# Patient Record
Sex: Male | Born: 1950 | Race: White | Hispanic: No | Marital: Married | Smoking: Former smoker
Health system: Southern US, Community
[De-identification: ages and names within clinical notes are randomized; demographics above are authoritative.]

## PROBLEM LIST (undated history)

## (undated) DIAGNOSIS — E785 Hyperlipidemia, unspecified: Secondary | ICD-10-CM

## (undated) DIAGNOSIS — I10 Essential (primary) hypertension: Secondary | ICD-10-CM

## (undated) DIAGNOSIS — G4733 Obstructive sleep apnea (adult) (pediatric): Secondary | ICD-10-CM

## (undated) HISTORY — PX: KNEE ARTHROSCOPY: SUR90

## (undated) HISTORY — PX: CATARACT EXTRACTION: SUR2

## (undated) HISTORY — PX: FINGER TENDON REPAIR: SHX1640

## (undated) HISTORY — DX: Hyperlipidemia, unspecified: E78.5

## (undated) HISTORY — DX: Obstructive sleep apnea (adult) (pediatric): G47.33

---
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
## 2002-11-07 ENCOUNTER — Ambulatory Visit (HOSPITAL_COMMUNITY): Admission: RE | Admit: 2002-11-07 | Discharge: 2002-11-07 | Payer: Self-pay | Admitting: *Deleted

## 2002-11-07 ENCOUNTER — Encounter: Payer: Self-pay | Admitting: Internal Medicine

## 2005-04-20 ENCOUNTER — Ambulatory Visit: Payer: Self-pay | Admitting: Internal Medicine

## 2005-04-27 ENCOUNTER — Ambulatory Visit: Payer: Self-pay | Admitting: Internal Medicine

## 2005-05-19 ENCOUNTER — Ambulatory Visit: Payer: Self-pay | Admitting: Internal Medicine

## 2005-05-26 ENCOUNTER — Ambulatory Visit: Payer: Self-pay | Admitting: Internal Medicine

## 2005-08-12 ENCOUNTER — Ambulatory Visit: Payer: Self-pay | Admitting: Internal Medicine

## 2005-08-25 ENCOUNTER — Ambulatory Visit: Payer: Self-pay | Admitting: Internal Medicine

## 2006-03-06 ENCOUNTER — Ambulatory Visit: Payer: Self-pay | Admitting: Internal Medicine

## 2006-03-14 ENCOUNTER — Ambulatory Visit: Payer: Self-pay | Admitting: Internal Medicine

## 2006-06-06 ENCOUNTER — Ambulatory Visit: Payer: Self-pay | Admitting: Internal Medicine

## 2006-06-06 LAB — CONVERTED CEMR LAB
ALT: 41 units/L — ABNORMAL HIGH (ref 0–40)
AST: 35 units/L (ref 0–37)
Albumin: 4.1 g/dL (ref 3.5–5.2)
Alkaline Phosphatase: 52 units/L (ref 39–117)
BUN: 21 mg/dL (ref 6–23)
Basophils Absolute: 0 10*3/uL (ref 0.0–0.1)
Basophils Relative: 0 % (ref 0.0–1.0)
CO2: 28 meq/L (ref 19–32)
Calcium: 9.7 mg/dL (ref 8.4–10.5)
Chloride: 107 meq/L (ref 96–112)
Chol/HDL Ratio, serum: 5.1
Cholesterol: 231 mg/dL (ref 0–200)
Creatinine, Ser: 1.2 mg/dL (ref 0.4–1.5)
Eosinophil percent: 3.7 % (ref 0.0–5.0)
GFR calc non Af Amer: 67 mL/min
Glomerular Filtration Rate, Af Am: 81 mL/min/{1.73_m2}
Glucose, Bld: 109 mg/dL — ABNORMAL HIGH (ref 70–99)
HCT: 44.8 % (ref 39.0–52.0)
HDL: 44.9 mg/dL (ref >39.0)
Hemoglobin: 15.2 g/dL (ref 13.0–17.0)
Hgb A1c MFr Bld: 5.8 % (ref 4.6–6.0)
LDL DIRECT: 161.4 mg/dL
Lymphocytes Relative: 33.3 % (ref 12.0–46.0)
MCHC: 33.8 g/dL (ref 30.0–36.0)
MCV: 89.6 fL (ref 78.0–100.0)
Monocytes Absolute: 0.5 10*3/uL (ref 0.2–0.7)
Monocytes Relative: 10.5 % (ref 3.0–11.0)
Neutro Abs: 2.5 10*3/uL (ref 1.4–7.7)
Neutrophils Relative %: 52.5 % (ref 43.0–77.0)
PSA: 0.94 ng/mL (ref 0.10–4.00)
Platelets: 248 10*3/uL (ref 150–400)
Potassium: 4.1 meq/L (ref 3.5–5.1)
RBC: 5.01 M/uL (ref 4.22–5.81)
RDW: 11.6 % (ref 11.5–14.6)
Sodium: 143 meq/L (ref 135–145)
TSH: 1.95 microintl units/mL (ref 0.35–5.50)
Total Bilirubin: 1.3 mg/dL — ABNORMAL HIGH (ref 0.3–1.2)
Total Protein: 6.8 g/dL (ref 6.0–8.3)
Triglyceride fasting, serum: 104 mg/dL (ref 0–149)
VLDL: 21 mg/dL (ref 0–40)
WBC: 4.8 10*3/uL (ref 4.5–10.5)

## 2006-06-13 ENCOUNTER — Ambulatory Visit: Payer: Self-pay | Admitting: Internal Medicine

## 2006-09-12 ENCOUNTER — Ambulatory Visit: Payer: Self-pay | Admitting: Internal Medicine

## 2006-09-12 LAB — CONVERTED CEMR LAB
ALT: 41 units/L — ABNORMAL HIGH (ref 0–40)
AST: 29 units/L (ref 0–37)
Albumin: 4.1 g/dL (ref 3.5–5.2)
Alkaline Phosphatase: 54 units/L (ref 39–117)
Bilirubin, Direct: 0.3 mg/dL (ref 0.0–0.3)
Cholesterol: 188 mg/dL (ref 0–200)
HDL: 42.8 mg/dL (ref >39.0)
LDL Cholesterol: 129 mg/dL — ABNORMAL HIGH (ref 0–99)
Total Bilirubin: 1.6 mg/dL — ABNORMAL HIGH (ref 0.3–1.2)
Total CHOL/HDL Ratio: 4.4
Total Protein: 6.6 g/dL (ref 6.0–8.3)
Triglycerides: 79 mg/dL (ref 0–149)
VLDL: 16 mg/dL (ref 0–40)

## 2006-09-19 ENCOUNTER — Ambulatory Visit: Payer: Self-pay | Admitting: Internal Medicine

## 2007-03-13 ENCOUNTER — Ambulatory Visit: Payer: Self-pay | Admitting: Internal Medicine

## 2007-03-14 LAB — CONVERTED CEMR LAB
ALT: 47 units/L (ref 0–53)
AST: 31 units/L (ref 0–37)
Albumin: 4 g/dL (ref 3.5–5.2)
Alkaline Phosphatase: 55 units/L (ref 39–117)
Bilirubin, Direct: 0.2 mg/dL (ref 0.0–0.3)
Cholesterol: 194 mg/dL (ref 0–200)
HDL: 41.5 mg/dL (ref >39.0)
LDL Cholesterol: 129 mg/dL — ABNORMAL HIGH (ref 0–99)
Total Bilirubin: 1.3 mg/dL — ABNORMAL HIGH (ref 0.3–1.2)
Total CHOL/HDL Ratio: 4.7
Total Protein: 6.6 g/dL (ref 6.0–8.3)
Triglycerides: 120 mg/dL (ref 0–149)
VLDL: 24 mg/dL (ref 0–40)

## 2007-03-21 ENCOUNTER — Ambulatory Visit: Payer: Self-pay | Admitting: Internal Medicine

## 2007-03-21 DIAGNOSIS — E785 Hyperlipidemia, unspecified: Secondary | ICD-10-CM

## 2007-09-17 ENCOUNTER — Ambulatory Visit: Payer: Self-pay | Admitting: Internal Medicine

## 2007-09-17 LAB — CONVERTED CEMR LAB
ALT: 31 units/L (ref 0–53)
AST: 27 units/L (ref 0–37)
Albumin: 4.1 g/dL (ref 3.5–5.2)
BUN: 19 mg/dL (ref 6–23)
Basophils Absolute: 0 10*3/uL (ref 0.0–0.1)
Bilirubin Urine: NEGATIVE
Blood in Urine, dipstick: NEGATIVE
Chloride: 110 meq/L (ref 96–112)
Creatinine, Ser: 1.1 mg/dL (ref 0.4–1.5)
GFR calc Af Amer: 90 mL/min
GFR calc non Af Amer: 74 mL/min
Glucose, Bld: 106 mg/dL — ABNORMAL HIGH (ref 70–99)
HCT: 42.8 % (ref 39.0–52.0)
Hemoglobin: 14.6 g/dL (ref 13.0–17.0)
Lymphocytes Relative: 22.2 % (ref 12.0–46.0)
MCV: 88.6 fL (ref 78.0–100.0)
Neutro Abs: 3.8 10*3/uL (ref 1.4–7.7)
PSA: 0.94 ng/mL (ref 0.10–4.00)
Sodium: 143 meq/L (ref 135–145)
Specific Gravity, Urine: 1.02
TSH: 2.01 microintl units/mL (ref 0.35–5.50)
WBC Urine, dipstick: NEGATIVE
WBC: 5.6 10*3/uL (ref 4.5–10.5)
pH: 7.5

## 2007-09-18 LAB — CONVERTED CEMR LAB
Albumin: 4.1 g/dL (ref 3.5–5.2)
Alkaline Phosphatase: 51 units/L (ref 39–117)
Basophils Absolute: 0 10*3/uL (ref 0.0–0.1)
Bilirubin, Direct: 0.3 mg/dL (ref 0.0–0.3)
Calcium: 9.6 mg/dL (ref 8.4–10.5)
Creatinine, Ser: 1.1 mg/dL (ref 0.4–1.5)
Eosinophils Absolute: 0.1 10*3/uL (ref 0.0–0.6)
Eosinophils Relative: 2 % (ref 0.0–5.0)
Glucose, Bld: 106 mg/dL — ABNORMAL HIGH (ref 70–99)
HCT: 42.8 % (ref 39.0–52.0)
Hemoglobin: 14.6 g/dL (ref 13.0–17.0)
Lymphocytes Relative: 22.2 % (ref 12.0–46.0)
MCHC: 34.1 g/dL (ref 30.0–36.0)
Monocytes Relative: 9.4 % (ref 3.0–11.0)
Neutro Abs: 3.8 10*3/uL (ref 1.4–7.7)
PSA: 0.94 ng/mL (ref 0.10–4.00)
RDW: 11.7 % (ref 11.5–14.6)
TSH: 2.01 microintl units/mL (ref 0.35–5.50)
Total Bilirubin: 1.5 mg/dL — ABNORMAL HIGH (ref 0.3–1.2)
Total CHOL/HDL Ratio: 4.5
VLDL: 32 mg/dL (ref 0–40)
WBC: 5.6 10*3/uL (ref 4.5–10.5)

## 2007-09-26 ENCOUNTER — Encounter: Payer: Self-pay | Admitting: Internal Medicine

## 2007-09-26 ENCOUNTER — Ambulatory Visit (HOSPITAL_BASED_OUTPATIENT_CLINIC_OR_DEPARTMENT_OTHER): Admission: RE | Admit: 2007-09-26 | Discharge: 2007-09-26 | Payer: Self-pay | Admitting: Internal Medicine

## 2007-10-09 ENCOUNTER — Ambulatory Visit: Payer: Self-pay | Admitting: Pulmonary Disease

## 2008-02-05 ENCOUNTER — Telehealth (INDEPENDENT_AMBULATORY_CARE_PROVIDER_SITE_OTHER): Payer: Self-pay | Admitting: *Deleted

## 2008-02-26 ENCOUNTER — Encounter: Payer: Self-pay | Admitting: Pulmonary Disease

## 2008-02-26 ENCOUNTER — Encounter: Payer: Self-pay | Admitting: Internal Medicine

## 2008-02-26 ENCOUNTER — Ambulatory Visit (HOSPITAL_BASED_OUTPATIENT_CLINIC_OR_DEPARTMENT_OTHER): Admission: RE | Admit: 2008-02-26 | Discharge: 2008-02-26 | Payer: Self-pay | Admitting: Internal Medicine

## 2008-03-10 ENCOUNTER — Ambulatory Visit: Payer: Self-pay | Admitting: Pulmonary Disease

## 2008-03-20 ENCOUNTER — Telehealth: Payer: Self-pay | Admitting: Internal Medicine

## 2008-03-25 ENCOUNTER — Telehealth: Payer: Self-pay | Admitting: Internal Medicine

## 2008-04-01 ENCOUNTER — Ambulatory Visit: Payer: Self-pay | Admitting: Internal Medicine

## 2008-04-01 LAB — CONVERTED CEMR LAB
ALT: 30 units/L (ref 0–53)
AST: 28 units/L (ref 0–37)
Albumin: 4.3 g/dL (ref 3.5–5.2)
Alkaline Phosphatase: 53 units/L (ref 39–117)
Cholesterol: 181 mg/dL (ref 0–200)
HDL: 40.3 mg/dL (ref >39.0)
Total Bilirubin: 2.6 mg/dL — ABNORMAL HIGH (ref 0.3–1.2)
Total CHOL/HDL Ratio: 4.5
Triglycerides: 106 mg/dL (ref 0–149)
VLDL: 21 mg/dL (ref 0–40)

## 2008-04-09 ENCOUNTER — Ambulatory Visit: Payer: Self-pay | Admitting: Pulmonary Disease

## 2008-05-08 ENCOUNTER — Ambulatory Visit: Payer: Self-pay | Admitting: Pulmonary Disease

## 2008-06-19 ENCOUNTER — Ambulatory Visit: Payer: Self-pay | Admitting: Pulmonary Disease

## 2008-08-12 ENCOUNTER — Ambulatory Visit: Payer: Self-pay | Admitting: Internal Medicine

## 2008-08-12 LAB — CONVERTED CEMR LAB
AST: 23 units/L (ref 0–37)
Bilirubin Urine: NEGATIVE
CO2: 28 meq/L (ref 19–32)
Chloride: 111 meq/L (ref 96–112)
Creatinine, Ser: 1 mg/dL (ref 0.4–1.5)
Eosinophils Absolute: 0.1 10*3/uL (ref 0.0–0.7)
Eosinophils Relative: 1.7 % (ref 0.0–5.0)
GFR calc non Af Amer: 82 mL/min
HCT: 38.9 % — ABNORMAL LOW (ref 39.0–52.0)
Ketones, ur: NEGATIVE mg/dL
Leukocytes, UA: NEGATIVE
Lymphocytes Relative: 27.1 % (ref 12.0–46.0)
Monocytes Absolute: 0.4 10*3/uL (ref 0.1–1.0)
Neutrophils Relative %: 62.1 % (ref 43.0–77.0)
Nitrite: NEGATIVE
PSA: 1 ng/mL (ref 0.10–4.00)
Potassium: 4 meq/L (ref 3.5–5.1)
RBC: 4.38 M/uL (ref 4.22–5.81)
RDW: 11.7 % (ref 11.5–14.6)
Sodium: 144 meq/L (ref 135–145)
TSH: 1.48 microintl units/mL (ref 0.35–5.50)
Total Bilirubin: 1.4 mg/dL — ABNORMAL HIGH (ref 0.3–1.2)
Total Protein, Urine: NEGATIVE mg/dL
Triglycerides: 72 mg/dL (ref 0–149)
Urine Glucose: NEGATIVE mg/dL
Urobilinogen, UA: 0.2 (ref 0.0–1.0)
WBC: 4.7 10*3/uL (ref 4.5–10.5)

## 2008-08-19 ENCOUNTER — Ambulatory Visit: Payer: Self-pay | Admitting: Internal Medicine

## 2008-12-16 ENCOUNTER — Ambulatory Visit: Payer: Self-pay | Admitting: Pulmonary Disease

## 2008-12-16 DIAGNOSIS — G4733 Obstructive sleep apnea (adult) (pediatric): Secondary | ICD-10-CM | POA: Insufficient documentation

## 2009-02-24 ENCOUNTER — Ambulatory Visit: Payer: Self-pay | Admitting: Internal Medicine

## 2009-02-24 LAB — CONVERTED CEMR LAB
ALT: 30 units/L (ref 0–53)
BUN: 19 mg/dL (ref 6–23)
Bilirubin, Direct: 0.2 mg/dL (ref 0.0–0.3)
Creatinine, Ser: 0.9 mg/dL (ref 0.4–1.5)
GFR calc non Af Amer: 92.8 mL/min (ref >60)
HDL: 45.1 mg/dL (ref >39.00)
Hgb A1c MFr Bld: 5.8 % (ref 4.6–6.5)
LDL Cholesterol: 85 mg/dL (ref 0–99)
Potassium: 4.5 meq/L (ref 3.5–5.1)
Sodium: 141 meq/L (ref 135–145)
Total Bilirubin: 1.9 mg/dL — ABNORMAL HIGH (ref 0.3–1.2)
Total CHOL/HDL Ratio: 3
Triglycerides: 80 mg/dL (ref 0.0–149.0)

## 2009-03-03 ENCOUNTER — Ambulatory Visit: Payer: Self-pay | Admitting: Internal Medicine

## 2009-09-08 ENCOUNTER — Ambulatory Visit: Payer: Self-pay | Admitting: Internal Medicine

## 2009-09-08 LAB — CONVERTED CEMR LAB
Alkaline Phosphatase: 45 units/L (ref 39–117)
BUN: 17 mg/dL (ref 6–23)
Basophils Relative: 0.2 % (ref 0.0–3.0)
CO2: 32 meq/L (ref 19–32)
Chloride: 107 meq/L (ref 96–112)
Cholesterol: 160 mg/dL (ref 0–200)
Eosinophils Absolute: 0.1 10*3/uL (ref 0.0–0.7)
Eosinophils Relative: 1.9 % (ref 0.0–5.0)
HDL: 61.6 mg/dL (ref >39.00)
Hemoglobin, Urine: NEGATIVE
Hemoglobin: 14.6 g/dL (ref 13.0–17.0)
Hgb A1c MFr Bld: 5.6 % (ref 4.6–6.5)
Ketones, ur: NEGATIVE mg/dL
Lymphocytes Relative: 32.4 % (ref 12.0–46.0)
Lymphs Abs: 1.3 10*3/uL (ref 0.7–4.0)
MCV: 93.7 fL (ref 78.0–100.0)
Monocytes Absolute: 0.4 10*3/uL (ref 0.1–1.0)
Monocytes Relative: 10.3 % (ref 3.0–12.0)
Neutro Abs: 2.1 10*3/uL (ref 1.4–7.7)
Neutrophils Relative %: 55.2 % (ref 43.0–77.0)
Nitrite: NEGATIVE
Potassium: 4.2 meq/L (ref 3.5–5.1)
RBC: 4.73 M/uL (ref 4.22–5.81)
RDW: 11.8 % (ref 11.5–14.6)
Sodium: 142 meq/L (ref 135–145)
Specific Gravity, Urine: 1.01 (ref 1.000–1.030)
TSH: 1.79 microintl units/mL (ref 0.35–5.50)
Total Protein: 7 g/dL (ref 6.0–8.3)
Urobilinogen, UA: 0.2 (ref 0.0–1.0)

## 2009-10-12 ENCOUNTER — Ambulatory Visit: Payer: Self-pay | Admitting: Internal Medicine

## 2009-10-12 DIAGNOSIS — R0609 Other forms of dyspnea: Secondary | ICD-10-CM

## 2009-10-12 DIAGNOSIS — R0989 Other specified symptoms and signs involving the circulatory and respiratory systems: Secondary | ICD-10-CM

## 2009-10-20 ENCOUNTER — Ambulatory Visit: Payer: Self-pay | Admitting: Internal Medicine

## 2009-10-21 ENCOUNTER — Ambulatory Visit: Payer: Self-pay | Admitting: Internal Medicine

## 2009-11-03 ENCOUNTER — Ambulatory Visit: Payer: Self-pay | Admitting: Pulmonary Disease

## 2009-11-04 ENCOUNTER — Encounter: Payer: Self-pay | Admitting: Internal Medicine

## 2009-12-16 ENCOUNTER — Ambulatory Visit: Payer: Self-pay | Admitting: Pulmonary Disease

## 2009-12-30 ENCOUNTER — Encounter: Payer: Self-pay | Admitting: Pulmonary Disease

## 2009-12-31 ENCOUNTER — Ambulatory Visit: Payer: Self-pay | Admitting: Pulmonary Disease

## 2010-02-20 ENCOUNTER — Encounter: Payer: Self-pay | Admitting: Pulmonary Disease

## 2010-08-12 NOTE — Assessment & Plan Note (Signed)
Summary: home sleep study shows no significant osa   Copy to:  Birdie Sons Primary Provider/Referring Provider:  Swords   History of Present Illness: the pt has undergone overnight sleep testing with a type 3 home monitoring device.  The data and tracings have been reviewed with with the following findings:  1) flow evaluation period of 6h . 2) no apnea and no hypopneas were noted. 3) low sat transiently to 82%, with minimal time less than 88%.    Allergies: 1)  Aspirin (Aspirin)   Impression & Recommendations:  Problem # 1:  OBSTRUCTIVE SLEEP APNEA (ICD-327.23)  the pt has a h/o osa for which he has been wearing cpap.  He has lost weight, and now is asymptomatic whenever he doesn't wear cpap.  His home study shows no clinically significant osa.  Other Orders: Sleep Std Airflow/Heartrate and O2 SAT unattended (30865)  Patient Instructions: 1)  discontinue cpap.  left message on his cell about this.  I have asked him to call me with questions or concerns.

## 2010-08-12 NOTE — Letter (Signed)
Summary: [facility]  [facility]   Imported By: Lester  02/24/2010 10:16:38  _____________________________________________________________________  External Attachment:    Type:   Image     Comment:   External Document

## 2010-08-12 NOTE — Assessment & Plan Note (Signed)
Summary: cpx/mm/pt rsc from bmp/cjr   Vital Signs:  Patient profile:   60 year old male Height:      72 inches Weight:      197 pounds BMI:     26.81 Pulse rate:   88 / minute Pulse rhythm:   regular Resp:     12 per minute BP sitting:   128 / 70  (left arm)  Vitals Entered By: Gladis Riffle, RN (October 12, 2009 9:16 AM) CC: cpx, labs --has form Is Patient Diabetic? No   Primary Care Provider:  Jillann Charette  CC:  cpx and labs --has form.  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 1/2 By Mouth Daily  Allergies: 1)  Aspirin (Aspirin)  Past History:  Past Medical History: Last updated: 08/19/2008  Hyperlipidemia mild OSA---Dr. Shelle Iron  Past Surgical History: Last updated: 02/15/2007 Finger repair Knee arthroscopy Colonoscopy-2004  Family History: Last updated: 02/15/2007 Family History High cholesterol  Social History: Last updated: 04/09/2008 Married Alcohol use-yes Drug use-no Regular exercise-yes Occasional Cigar Occupation: engineer--self employed.  pt has children.  Risk Factors: Exercise: yes (02/15/2007)  Risk Factors: Smoking Status: current (10/12/2009)  Physical Exam  General:  alert and well-developed.   Head:  normocephalic and atraumatic.   Eyes:  pupils equal and pupils round.   Ears:  R ear normal and L ear normal.   Nose:  External nasal examination shows no deformity or inflammation. Nasal mucosa are pink and moist without lesions or exudates. Mouth:  Oral mucosa and  oropharynx without lesions or exudates.  Teeth in good repair. Neck:  No deformities, masses, or tenderness noted. Chest Wall:  no deformities and no tenderness.   Lungs:  normal respiratory effort and no intercostal retractions.   Heart:  normal rate and regular rhythm.   Abdomen:  Bowel sounds positive,abdomen soft and non-tender without masses, organomegaly or hernias noted. Rectal:  no external abnormalities and no hemorrhoids.   Prostate:  no nodules and no asymmetry.   Msk:  No deformity or scoliosis noted of thoracic or lumbar spine.   Pulses:  R radial normal and L radial normal.   Neurologic:  cranial nerves II-XII intact and gait normal.   Skin:  turgor normal and color normal.   Cervical Nodes:  no anterior cervical adenopathy and no posterior cervical adenopathy.   Psych:  memory intact for recent and remote and good eye contact.     Impression & Recommendations:  Problem # 1:  PREVENTIVE HEALTH CARE (ICD-V70.0)  Health Maint UTD  Orders: EKG w/ Interpretation (93000)  Problem # 2:  HYPERLIPIDEMIA (ICD-272.4) note HDL His updated medication list for this problem includes:    Crestor 10 Mg Tabs (Rosuvastatin calcium) .Marland Kitchen... 1/2 by mouth daily  Labs Reviewed: SGOT: 24 (09/08/2009)   SGPT: 24 (09/08/2009)   HDL:61.60 (09/08/2009), 45.10 (02/24/2009)  LDL:87 (09/08/2009), 85 (02/24/2009)  Chol:160 (09/08/2009), 146 (02/24/2009)  Trig:57.0 (09/08/2009), 80.0 (02/24/2009)  Problem # 3:  GILBERT'S SYNDROME (ICD-277.4) reviewed labs  Complete Medication List: 1)  Crestor 10 Mg Tabs (Rosuvastatin  calcium) .... 1/2 by mouth daily  Appended Document: cpx/mm/pt rsc from bmp/cjr     Allergies: 1)  Aspirin (Aspirin)   Impression & Recommendations:  Problem # 1:  DYSPNEA/SHORTNESS OF BREATH (ICD-786.09) given his exercise capacity I doubt this is of concern Orders: T-2 View CXR (71020TC)  Complete Medication List: 1)  Crestor 10 Mg Tabs (Rosuvastatin calcium) ....  1/2 by mouth daily 2)  Trazodone Hcl 50 Mg Tabs (Trazodone hcl) .Marland Kitchen.. 1 by mouth at bedtime as needed insomnia Prescriptions: TRAZODONE HCL 50 MG TABS (TRAZODONE HCL) 1 by mouth at bedtime as needed insomnia  #30 x 3   Entered and Authorized by:   Birdie Sons MD   Signed by:   Birdie Sons MD on 10/12/2009   Method used:   Print then Give to Patient   RxID:   [id number]

## 2010-08-12 NOTE — Miscellaneous (Signed)
Summary: Orders Update pft charges  Clinical Lists Changes  Orders: Added new Service order of Carbon Monoxide diffusing w/capacity (94720) - Signed Added new Service order of Lung Volumes (94240) - Signed Added new Service order of Spirometry (Pre & Post) (94060) - Signed 

## 2010-08-12 NOTE — Assessment & Plan Note (Signed)
Summary: rov for osa   Copy to:  Birdie Sons Primary Provider/Referring Provider:  Cato Mulligan  CC:  Pt is here for a 1 yr f/u appt on his OSA.  Pt states he wears his cpap machine every night from 9pm to 1 am at which point he will pull mask off and not use it because he states he has difficulty falling back asleep.  Pt denied any complaints with mask or pressure.  Marland Kitchen  History of Present Illness: the pt comes in today for f/u of his mild osa.  He has lost 26 pounds since last visit and feels well.  He is wearing cpap everynight, but takes off if he awakens during the night and can't get back to sleep.  When he doesn't wear cpap, his wife has not complained about his snoring.  He has no issues with mask or pressure.  Medications Prior to Update: 1)  Crestor 10 Mg Tabs (Rosuvastatin Calcium) .... 1/2 By Mouth Daily 2)  Trazodone Hcl 50 Mg Tabs (Trazodone Hcl) .Marland Kitchen.. 1 By Mouth At Bedtime As Needed Insomnia  Allergies (verified): 1)  Aspirin (Aspirin)  Vital Signs:  Patient profile:   60 year old male Height:      72 inches Weight:      196 pounds BMI:     26.68 O2 Sat:      99 % on Room air Temp:     98.2 degrees F oral Pulse rate:   88 / minute BP sitting:   132 / 70  (left arm) Cuff size:   regular  Vitals Entered By: Arman Filter LPN (December 17, 5782 9:01 AM)  O2 Flow:  Room air CC: Pt is here for a 1 yr f/u appt on his OSA.  Pt states he wears his cpap machine every night from 9pm to 1 am at which point he will pull mask off and not use it because he states he has difficulty falling back asleep.  Pt denied any complaints with mask or pressure.   Comments Medications reviewed with patient Arman Filter LPN  December 16, 6960 9:01 AM    Physical Exam  General:  wd male in nad Nose:  no skin breakdown or pressure necrosis from cpap mask Extremities:  no edema or cyanosis Neurologic:  alert and oriented, moves all 4. does not appear to be sleepy   Impression &  Recommendations:  Problem # 1:  OBSTRUCTIVE SLEEP APNEA (ICD-327.23) the pt had mild osa at time of diagnosis, but now has lost considerable weight.  He may no longer have sleep disordered breathing, and can possibly get rid of cpap.  I have asked him to stay off device for a week to see how he feels, and will do home screening test with apnea link device to see if he is still having apnea.  Other Orders: Est. Patient Level III (95284) Misc. Referral (Misc. Ref)  Patient Instructions: 1)  stop cpap for next one week, and see how you feel. 2)  will do home screening test for sleep apnea to see if you still have an issue.  Will call with results. 3)  continue with slow weight loss.

## 2010-08-12 NOTE — Letter (Signed)
Summary: [facility]   Imported By: Sherian Rein 01/15/2010 09:53:52  _____________________________________________________________________  External Attachment:    Type:   Image     Comment:   External Document

## 2010-09-10 ENCOUNTER — Other Ambulatory Visit: Payer: Self-pay | Admitting: Internal Medicine

## 2010-09-28 IMAGING — CR DG CHEST 2V
2 series · 2 of 2 positions shown · non-contrast
Comparison: None.

CLINICAL DATA: Physical exam.  Short of breath with exertion. Sleep
apnea.  Smoker.

.
CHEST - 2 VIEW

[view not recorded (1 of 2)]
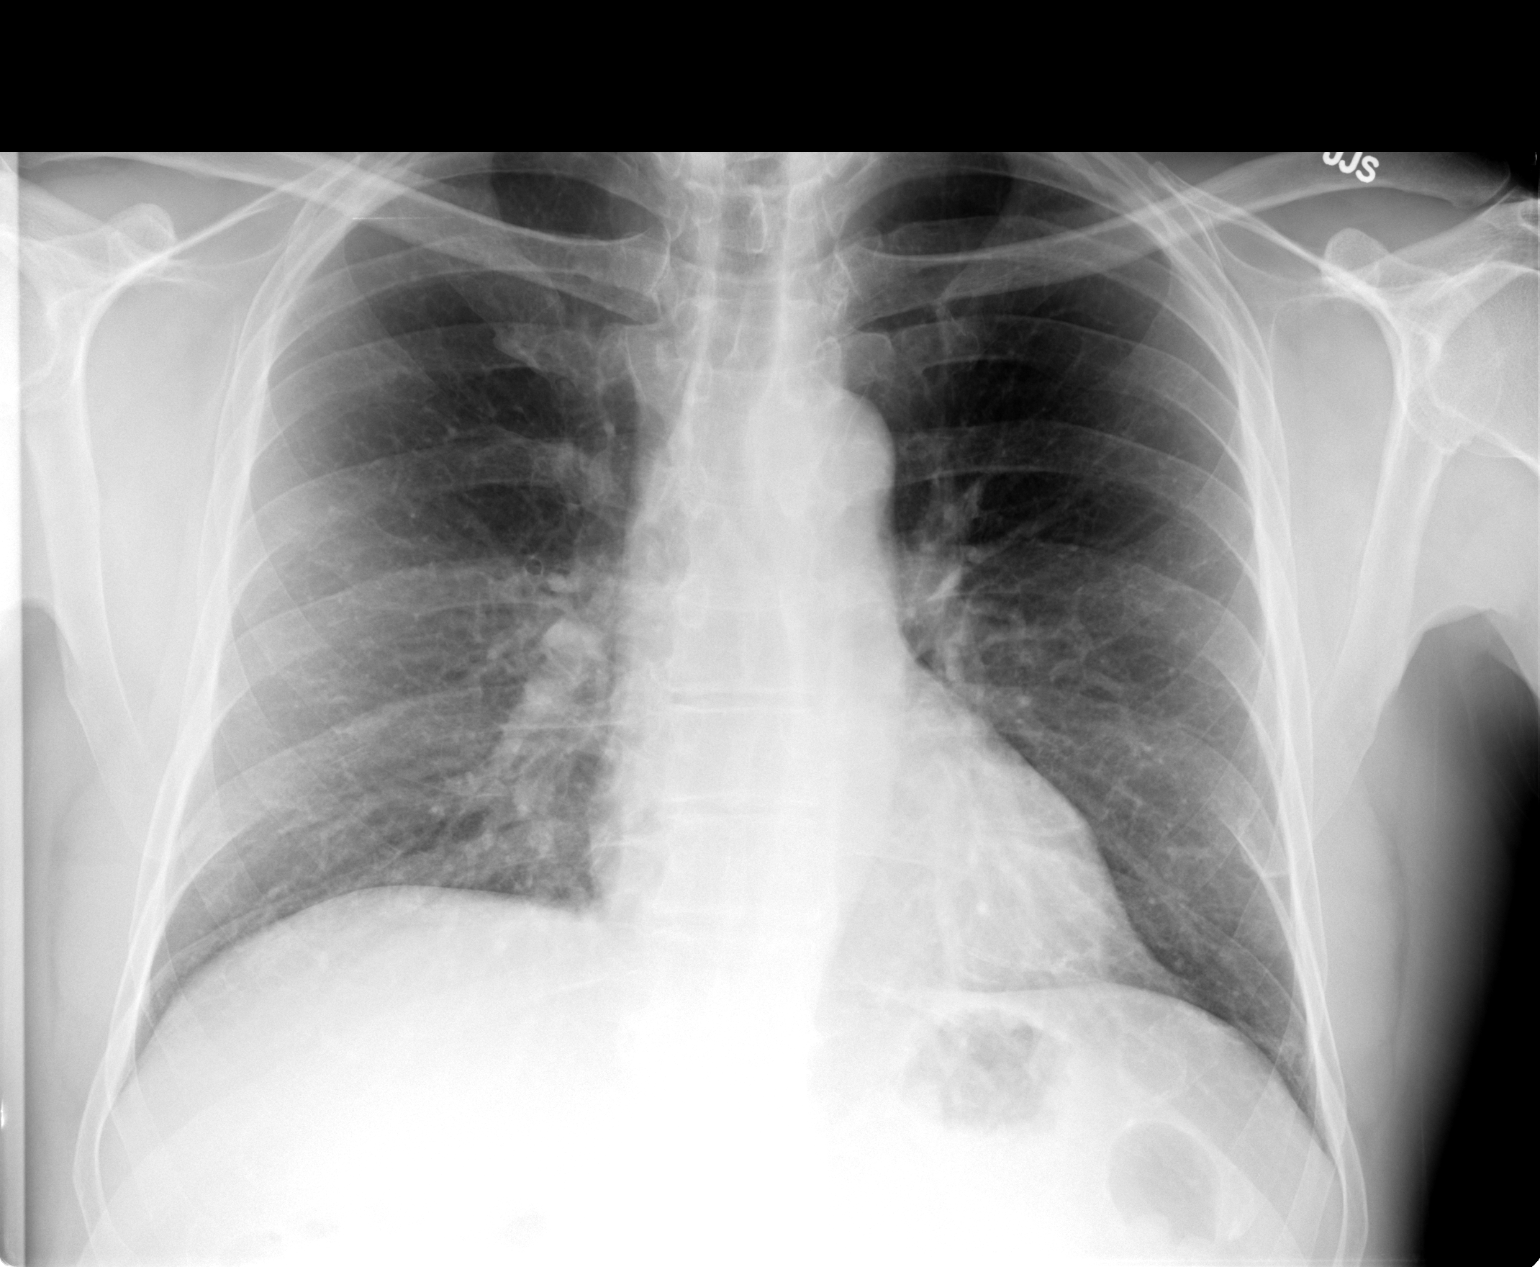

[view not recorded (2 of 2)]
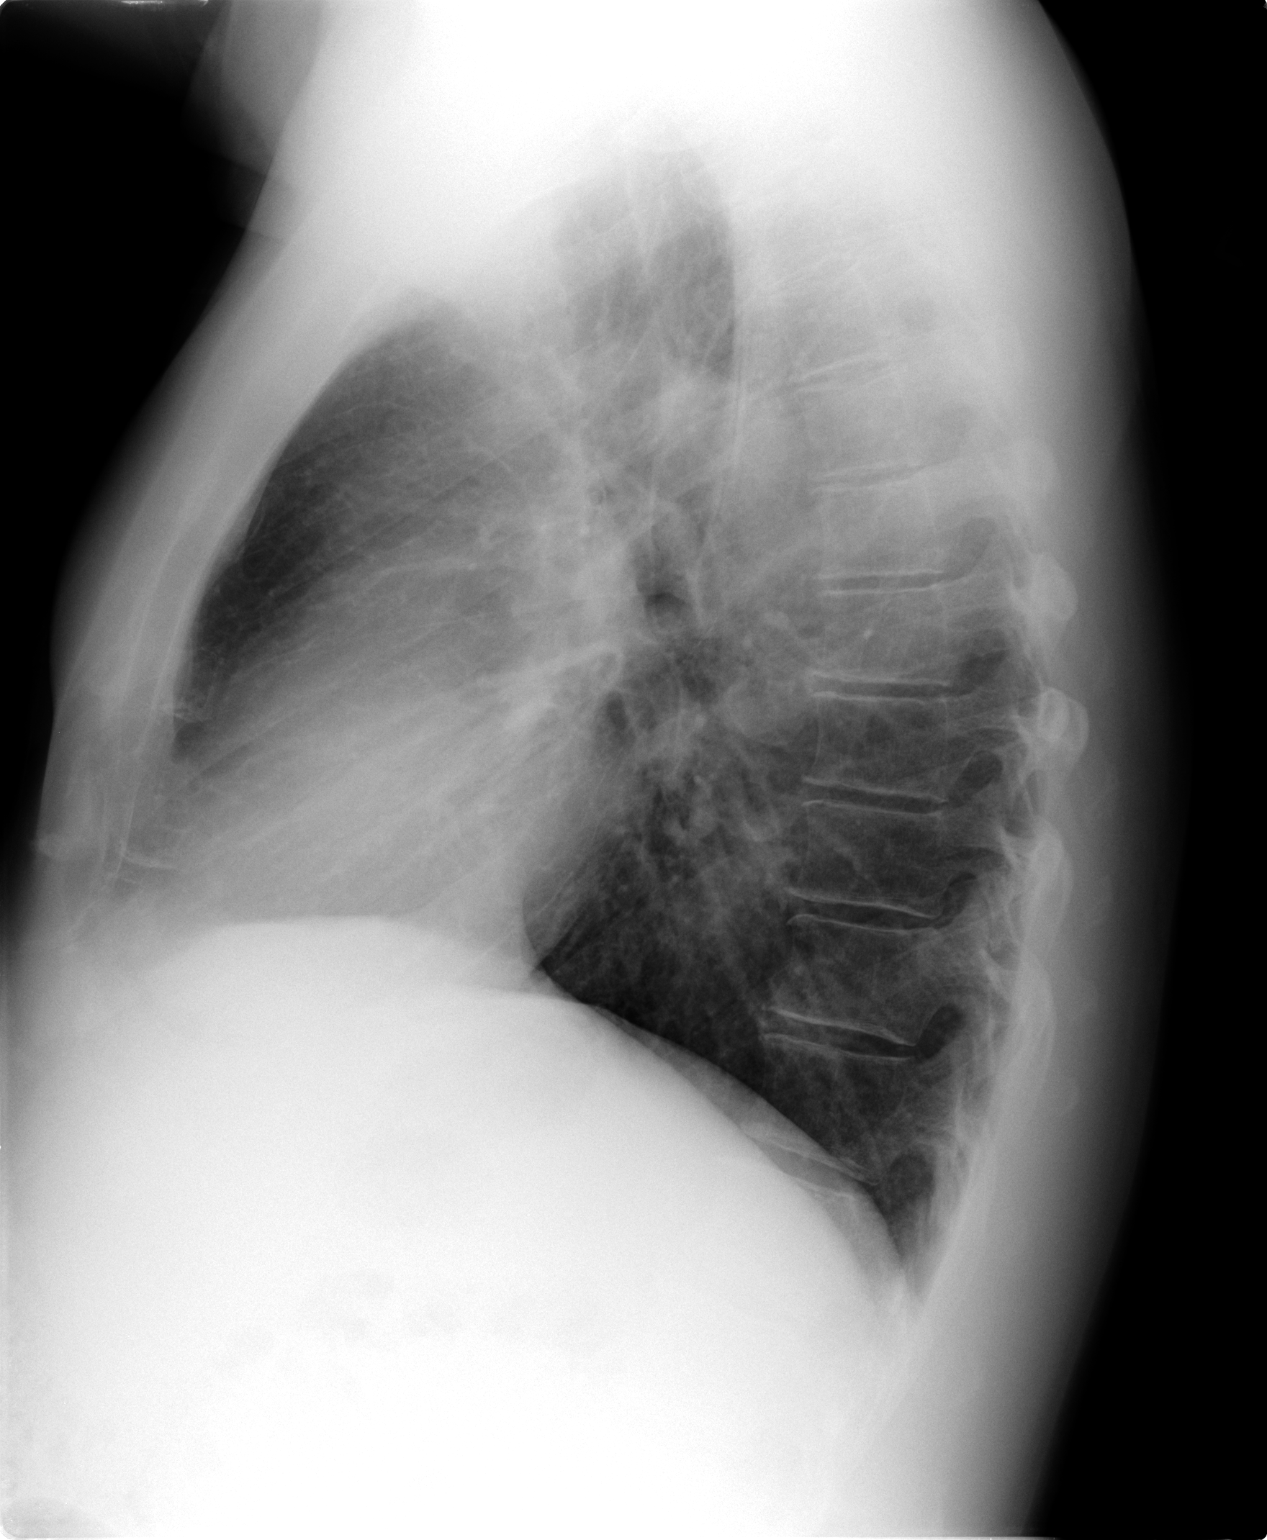

[2 of 2 positions shown; findings below may reference images not displayed]

FINDINGS: Emphysematous changes in the upper lobes/apices.  Lungs
are mildly hyperaerated.  No active infiltrate.  No vascular
congestion.  Normal cardiomediastinal silhouette size and contours.
Intact bony thorax.
IMPRESSION: COPD/emphysema.

## 2010-10-11 ENCOUNTER — Other Ambulatory Visit (INDEPENDENT_AMBULATORY_CARE_PROVIDER_SITE_OTHER): Payer: BC Managed Care – PPO

## 2010-10-11 ENCOUNTER — Other Ambulatory Visit: Payer: Self-pay | Admitting: Internal Medicine

## 2010-10-11 DIAGNOSIS — Z0000 Encounter for general adult medical examination without abnormal findings: Secondary | ICD-10-CM

## 2010-10-11 LAB — CBC WITH DIFFERENTIAL/PLATELET
Basophils Absolute: 0 10*3/uL (ref 0.0–0.1)
Eosinophils Absolute: 0.1 10*3/uL (ref 0.0–0.7)
Lymphocytes Relative: 26.7 % (ref 12.0–46.0)
MCHC: 34.8 g/dL (ref 30.0–36.0)
Neutrophils Relative %: 59.9 % (ref 43.0–77.0)
RBC: 4.27 Mil/uL (ref 4.22–5.81)
RDW: 12.4 % (ref 11.5–14.6)

## 2010-10-11 LAB — LIPID PANEL
Cholesterol: 173 mg/dL (ref 0–200)
LDL Cholesterol: 110 mg/dL — ABNORMAL HIGH (ref 0–99)
Triglycerides: 56 mg/dL (ref 0.0–149.0)
VLDL: 11.2 mg/dL (ref 0.0–40.0)

## 2010-10-11 LAB — BASIC METABOLIC PANEL
Chloride: 105 mEq/L (ref 96–112)
Potassium: 4.1 mEq/L (ref 3.5–5.1)

## 2010-10-11 LAB — HEPATIC FUNCTION PANEL
AST: 34 U/L (ref 0–37)
Albumin: 4.1 g/dL (ref 3.5–5.2)
Alkaline Phosphatase: 51 U/L (ref 39–117)
Total Protein: 6.4 g/dL (ref 6.0–8.3)

## 2010-10-11 LAB — URINALYSIS
Hgb urine dipstick: NEGATIVE
Specific Gravity, Urine: 1.025 (ref 1.000–1.030)
Urine Glucose: NEGATIVE
Urobilinogen, UA: 0.2 (ref 0.0–1.0)

## 2010-10-11 LAB — TSH: TSH: 1.55 u[IU]/mL (ref 0.35–5.50)

## 2010-10-14 ENCOUNTER — Encounter: Payer: Self-pay | Admitting: Internal Medicine

## 2010-10-18 ENCOUNTER — Encounter: Payer: Self-pay | Admitting: Internal Medicine

## 2010-10-18 ENCOUNTER — Telehealth: Payer: Self-pay | Admitting: *Deleted

## 2010-10-18 ENCOUNTER — Ambulatory Visit (INDEPENDENT_AMBULATORY_CARE_PROVIDER_SITE_OTHER): Payer: BC Managed Care – PPO | Admitting: Internal Medicine

## 2010-10-18 DIAGNOSIS — Z0000 Encounter for general adult medical examination without abnormal findings: Secondary | ICD-10-CM

## 2010-10-18 LAB — PSA: PSA: 1.16 ng/mL (ref 0.10–4.00)

## 2010-10-18 NOTE — Progress Notes (Signed)
  Subjective:    Patient ID: Paul Chang, male    DOB: [birth year], 60 y.o.   MRN: [id number]  HPI  cpx Past Medical History  Diagnosis Date  . Hyperlipidemia   . OSA (obstructive sleep apnea)    Past Surgical History  Procedure Date  . Finger tendon repair   . Knee arthroscopy     reports that he has been smoking Cigars.  He has never used smokeless tobacco. He reports that he drinks alcohol. He reports that he does not use illicit drugs. family history includes Deep vein thrombosis in his sister and Hyperlipidemia in his father. Allergies  Allergen Reactions  . Aspirin     REACTION: swelling     Review of Systems  patient denies chest pain, shortness of breath, orthopnea. Denies lower extremity edema, abdominal pain, change in appetite, change in bowel movements. Patient denies rashes, musculoskeletal complaints. No other specific complaints in a complete review of systems.      Objective:   Physical Exam Well-developed male in no acute distress. HEENT exam atraumatic, normocephalic, extraocular muscles are intact. Conjunctivae are pink without exudate. Neck is supple without lymphadenopathy, thyromegaly, jugular venous distention. Chest is clear to auscultation without increased work of breathing. Cardiac exam S1-S2 are regular 2/6 HSM. The PMI is normal. No significant murmurs or gallops. Abdominal exam active bowel sounds, soft, nontender. No abdominal bruits. Extremities no clubbing cyanosis or edema. Peripheral pulses are normal without bruits. Neurologic exam alert and oriented without any motor or sensory deficits. Rectal exam normal tone prostate normal size without masses or asymmetry.        Assessment & Plan:  Well visit Health maint UTD Advised absolute avoidance of all tobacco including rare cigar use

## 2010-10-18 NOTE — Telephone Encounter (Signed)
Pt wanted to know if you wanted him to get a colonoscopy.  Per pt he had a normal one 7 years ago.  Only call pt back if Dr Cato Mulligan recommends it.

## 2010-10-21 NOTE — Telephone Encounter (Signed)
LMTCB

## 2010-10-21 NOTE — Telephone Encounter (Signed)
Not necessary Have him do the IFOB test (stool)

## 2010-11-23 ENCOUNTER — Other Ambulatory Visit: Payer: Self-pay | Admitting: Internal Medicine

## 2010-11-23 DIAGNOSIS — E785 Hyperlipidemia, unspecified: Secondary | ICD-10-CM

## 2010-11-23 NOTE — Procedures (Signed)
NAME:  Paul Chang, SOLEM NO.:  [id number]   MEDICAL RECORD NO.:  [id number]          PATIENT TYPE:  OUT   LOCATION:  SLEEP CENTER                 FACILITY[facility]  [facility]   PHYSICIAN:  Barbaraann Share, MD,FCCPDATE OF BIRTH:  1950/10/11   DATE OF STUDY:  09/26/2007                            NOCTURNAL POLYSOMNOGRAM   REFERRING PHYSICIAN:  Valetta Mole. Swords, MD   INDICATIONS FOR PROCEDURE:  Hypersomnia with sleep apnea.   RESULTS:  Epward score is 13.   SLEEP ARCHITECTURE:  The patient had total sleep time of 276 minutes  with no low wave sleep and only 30 minutes of REM during the entire  night. Sleep onset latency was normal and REM onset was at the upper  limits of normal. Sleep efficiency was very decreased at 69%.   RESPIRATORY DATA:  The patient was found to have 4 central apnea's and 6  obstructive hypopnea's, for an apnea/hypopnea index of only 2 events per  hour. These occurred primarily during REM, and there was very loud  snoring noted throughout. The patient did not need split night protocol  secondary to the small numbers of events.   OXYGEN DATA:  There was transient O2 desaturation as low at 86% during  the night. The patient only spent 3 minutes the entire night, less than  90%.   CARDIAC DATA:  Rare PVC's but no clinically significant arrhythmia.   MOVEMENT/PARASOMNIA:  None.   IMPRESSION/RECOMMENDATION:  1. Small numbers of obstructive and central events, which do not meet      the apnea/hypopnea index criteria for the obstructive sleep apnea      syndrome. The patient did not meet split night criteria secondary      to the small numbers of events.  2. Rare PVC's but no clinically significant arrhythmia.      Barbaraann Share, MD,FCCP  Diplomate, American Board of Sleep  Medicine  Electronically Signed    [facility]/MEDQ  D:  10/09/2007 08:18:52  T:  10/09/2007 09:05:26  Job:  [id number]

## 2010-11-23 NOTE — Procedures (Signed)
NAME:  Paul, Chang NO.:  [id number]   MEDICAL RECORD NO.:  [id number]          PATIENT TYPE:  OUT   LOCATION:  SLEEP CENTER                 FACILITY:  [facility]   PHYSICIAN:  Barbaraann Share, MD,FCCPDATE OF BIRTH:  1950/12/17   DATE OF STUDY:  02/26/2008                            NOCTURNAL POLYSOMNOGRAM   REFERRING PHYSICIAN:  Valetta Mole. Swords, MD   INDICATION FOR STUDY:  Hypersomnia with sleep apnea.   EPWORTH SLEEPINESS SCORE:  15   MEDICATIONS:   SLEEP ARCHITECTURE:  The patient had a total sleep time of 399 minutes  with no slow wave sleep and only 64 minutes of REM.  Sleep onset latency  was normal at 7.5 minutes and REM onset was normal as well.  Sleep  efficiency was adequate at 93%.   RESPIRATORY DATA:  The patient was found to have 21 obstructive apneas,  23 central apneas, and 11 hypopneas for an apnea/hypopnea index of 8  events per hour.  He was also found to have 62 respiratory effort-  related arousals giving him a respiratory disturbance index of 18 events  per hour.  The events were definitely increased during REM, but were not  positional.  There was moderate to loud snoring noted throughout.  The  patient did not meet split-night criteria secondary to the majority of  his events occurring after 2 a.m.   OXYGEN DATA:  The patient was found to have O2 desaturation as low as  87% with his events.   CARDIAC DATA:  Occasional PAC and PVC.   MOVEMENT-PARASOMNIA:  The patient was found to have 64 periodic leg  movements with less than 1 per hour resulting in arousal or awakening.  There were no abnormal behaviors noted.   IMPRESSIONS-RECOMMENDATIONS:  1. Mild obstructive and central sleep apnea with an apnea/hypopnea      index of 8 events per hour and a respiratory disturbance index of      18 events per hour when respiratory effort-related arousals are      included.  There was O2 desaturation as low as 87% transiently with      his  events.  The patient did not meet split-night protocol      secondary to small numbers of events in the first half of the      night.  Treatment for this degree of sleep apnea can include weight      loss alone if applicable, upper airway surgery, oral appliance, and      also CPAP.  Clinical correlation is suggested.  2. Occasional premature atrial contractions and premature ventricular      contractions, but no clinically significant      arrhythmias were noted.  3. Small numbers of periodic leg movements with no significant sleep      disruption.      Barbaraann Share, MD,FCCP  Diplomate, American Board of Sleep  Medicine  Electronically Signed     [facility]/MEDQ  D:  03/12/2008 08:48:21  T:  03/12/2008 10:05:01  Job:  [id number]

## 2010-11-23 NOTE — Telephone Encounter (Signed)
Pt had req refill of Crestor 10 mg via [facility] a few weeks ago, and pt has rcvd a letter from mail order company that no response rcvd from Dr Cato Mulligan. Pls call this med into mail order pharmacy [phone] ref # [id number]  asap today.

## 2010-11-24 MED ORDER — ROSUVASTATIN CALCIUM 10 MG PO TABS: 10.0000 mg | ORAL_TABLET | Freq: Every day | ORAL | Status: DC

## 2010-11-24 NOTE — Telephone Encounter (Signed)
rx was sent in to [facility].  Sent in electronically to [facility]

## 2010-11-26 NOTE — Op Note (Signed)
   NAME:  SHAWAN, CORELLA NO.:  [id number]   MEDICAL RECORD NO.:  [id number]                   PATIENT TYPE:  AMB   LOCATION:  ENDO                                 FACILITY:  [facility]   PHYSICIAN:  Georgiana Spinner, M.D.                 DATE OF BIRTH:  11-May-1951   DATE OF PROCEDURE:  11/07/2002  DATE OF DISCHARGE:                                 OPERATIVE REPORT   PROCEDURE PERFORMED:  Colonoscopy.   ENDOSCOPIST:  Georgiana Spinner, M.D.   INDICATIONS FOR PROCEDURE:  Rectal bleeding.  See clinical note from March  23.   ANESTHESIA:  Demerol 100 mg, Versed 10 mg.   DESCRIPTION OF PROCEDURE:  With the patient mildly sedated in the left  lateral decubitus position, the Olympus video colonoscope was inserted in  the rectum after a normal rectal exam was performed and passed under direct  vision to the cecum, identified by the ileocecal valve and appendiceal  orifice, both of which were photographed.  We entered into the terminal  ileum which also appeared normal and was photographed.  From this point the  colonoscope was slowly withdrawn taking circumferential views of the entire  colonic mucosa stopping in the distal transverse colon and descending colon  where diverticula were seen and photographed.  We pulled back then all the  way to the rectum which appeared normal on direct view and also on retroflex  view.  The endoscope was straightened and withdrawn through the anal canal.  Possibly, a small hemorrhoid was seen and the perineum externally appeared  normal  The patient's vital signs and pulse oximeter remained stable.  The  patient tolerated the procedure well without apparent complications.   FINDINGS:  Diverticulosis, mild of the proximal left colon and possibly  small hemorrhoids seen.  Otherwise unremarkable colonoscopic examination.   PLAN:  Follow-up will be with me as needed.                                                 Georgiana Spinner,  M.D.    GMO/MEDQ  D:  11/07/2002  T:  11/07/2002  Job:  [id number]   cc:   Chales Salmon. Abigail Miyamoto, M.D.  [address]  [place]  [place] [address]  Fax: [phone]

## 2011-10-12 ENCOUNTER — Other Ambulatory Visit (INDEPENDENT_AMBULATORY_CARE_PROVIDER_SITE_OTHER): Payer: BC Managed Care – PPO

## 2011-10-12 DIAGNOSIS — Z0000 Encounter for general adult medical examination without abnormal findings: Secondary | ICD-10-CM

## 2011-10-12 LAB — HEPATIC FUNCTION PANEL
AST: 31 U/L (ref 0–37)
Albumin: 4.4 g/dL (ref 3.5–5.2)
Alkaline Phosphatase: 57 U/L (ref 39–117)
Total Protein: 6.8 g/dL (ref 6.0–8.3)

## 2011-10-12 LAB — POCT URINALYSIS DIPSTICK
Blood, UA: NEGATIVE
Leukocytes, UA: NEGATIVE
Nitrite, UA: NEGATIVE
Protein, UA: NEGATIVE
Urobilinogen, UA: 0.2
pH, UA: 7.5

## 2011-10-12 LAB — CBC WITH DIFFERENTIAL/PLATELET
Basophils Absolute: 0 10*3/uL (ref 0.0–0.1)
Basophils Relative: 0.4 % (ref 0.0–3.0)
Eosinophils Relative: 1.2 % (ref 0.0–5.0)
Hemoglobin: 14.6 g/dL (ref 13.0–17.0)
Lymphocytes Relative: 13.7 % (ref 12.0–46.0)
Monocytes Relative: 7.7 % (ref 3.0–12.0)
Neutro Abs: 4.8 10*3/uL (ref 1.4–7.7)
RBC: 4.71 Mil/uL (ref 4.22–5.81)
WBC: 6.2 10*3/uL (ref 4.5–10.5)

## 2011-10-12 LAB — LIPID PANEL
HDL: 56.2 mg/dL (ref >39.00)
LDL Cholesterol: 99 mg/dL (ref 0–99)
Total CHOL/HDL Ratio: 3
Triglycerides: 50 mg/dL (ref 0.0–149.0)

## 2011-10-12 LAB — BASIC METABOLIC PANEL
Calcium: 9.8 mg/dL (ref 8.4–10.5)
GFR: 67.92 mL/min (ref >60.00)
Sodium: 143 mEq/L (ref 135–145)

## 2011-10-19 ENCOUNTER — Encounter: Payer: BC Managed Care – PPO | Admitting: Internal Medicine

## 2011-10-26 ENCOUNTER — Ambulatory Visit (INDEPENDENT_AMBULATORY_CARE_PROVIDER_SITE_OTHER): Payer: BC Managed Care – PPO | Admitting: Internal Medicine

## 2011-10-26 ENCOUNTER — Encounter: Payer: Self-pay | Admitting: Internal Medicine

## 2011-10-26 VITALS — BP 140/80 | HR 92 | Temp 98.2°F | Ht 71.5 in | Wt 202.0 lb

## 2011-10-26 DIAGNOSIS — E785 Hyperlipidemia, unspecified: Secondary | ICD-10-CM

## 2011-10-26 DIAGNOSIS — Z0000 Encounter for general adult medical examination without abnormal findings: Secondary | ICD-10-CM

## 2011-10-26 DIAGNOSIS — H9190 Unspecified hearing loss, unspecified ear: Secondary | ICD-10-CM

## 2011-10-26 MED ORDER — ROSUVASTATIN CALCIUM 10 MG PO TABS: 10.0000 mg | ORAL_TABLET | Freq: Every day | ORAL | Status: DC

## 2011-10-27 ENCOUNTER — Encounter: Payer: Self-pay | Admitting: Gastroenterology

## 2011-10-30 NOTE — Progress Notes (Signed)
Patient ID: Paul Chang, male   DOB: [birth year], 61 y.o.   MRN: [id number] cpx  Past Medical History  Diagnosis Date  . Hyperlipidemia   . OSA (obstructive sleep apnea)     History   Social History  . Marital Status: Married    Spouse Name: N/A    Number of Children: N/A  . Years of Education: N/A   Occupational History  . Not on file.   Social History Main Topics  . Smoking status: Current Some Day Smoker -- 0.2 packs/day for 30 years    Types: Cigars  . Smokeless tobacco: Never Used  . Alcohol Use: Yes  . Drug Use: No  . Sexually Active:    Other Topics Concern  . Not on file   Social History Narrative  . No narrative on file    Past Surgical History  Procedure Date  . Finger tendon repair   . Knee arthroscopy     Family History  Problem Relation Age of Onset  . Hyperlipidemia Father   . Deep vein thrombosis Sister     Allergies  Allergen Reactions  . Aspirin     REACTION: swelling    Current Outpatient Prescriptions on File Prior to Visit  Medication Sig Dispense Refill  . Loratadine (CLARITIN PO) Take 1 tablet by mouth daily.      . rosuvastatin (CRESTOR) 10 MG tablet Take 1 tablet (10 mg total) by mouth daily. Or as directed  90 tablet  3  . traZODone (DESYREL) 50 MG tablet Take 50 mg by mouth at bedtime.           patient denies chest pain, shortness of breath, orthopnea. Denies lower extremity edema, abdominal pain, change in appetite, change in bowel movements. Patient denies rashes, musculoskeletal complaints. No other specific complaints in a complete review of systems.   BP 140/80  Pulse 92  Temp(Src) 98.2 F (36.8 C) (Oral)  Ht 5' 11.5" (1.816 m)  Wt 202 lb (91.627 kg)  BMI 27.78 kg/m2 Well-developed male in no acute distress. HEENT exam atraumatic, normocephalic, extraocular muscles are intact. Conjunctivae are pink without exudate. Neck is supple without lymphadenopathy, thyromegaly, jugular venous distention. Chest is clear to  auscultation without increased work of breathing. Cardiac exam S1-S2 are regular. The PMI is normal. No significant murmurs or gallops. Abdominal exam active bowel sounds, soft, nontender. No abdominal bruits. Extremities no clubbing cyanosis or edema. Peripheral pulses are normal without bruits. Neurologic exam alert and oriented without any motor or sensory deficits.  A/P: Well Visit  Health maint UTD

## 2011-11-28 ENCOUNTER — Telehealth: Payer: Self-pay | Admitting: Internal Medicine

## 2011-11-29 ENCOUNTER — Other Ambulatory Visit: Payer: Self-pay | Admitting: *Deleted

## 2011-11-29 MED ORDER — TRAZODONE HCL 50 MG PO TABS: 50.0000 mg | ORAL_TABLET | Freq: Every day | ORAL | Status: DC

## 2011-11-29 NOTE — Telephone Encounter (Signed)
Opened in error

## 2011-12-09 ENCOUNTER — Ambulatory Visit (AMBULATORY_SURGERY_CENTER): Payer: BC Managed Care – PPO | Admitting: *Deleted

## 2011-12-09 ENCOUNTER — Encounter: Payer: Self-pay | Admitting: Gastroenterology

## 2011-12-09 ENCOUNTER — Encounter: Payer: Self-pay | Admitting: *Deleted

## 2011-12-09 VITALS — Ht 72.0 in | Wt 200.7 lb

## 2011-12-09 DIAGNOSIS — Z1211 Encounter for screening for malignant neoplasm of colon: Secondary | ICD-10-CM

## 2011-12-09 MED ORDER — PEG-KCL-NACL-NASULF-NA ASC-C 100 G PO SOLR: ORAL | Status: DC

## 2011-12-09 NOTE — Progress Notes (Signed)
Patient ID: Paul Chang, male   DOB: Mar 11, 1951, 61 y.o.   MRN: [id number] Okay to have colonoscopy as scheduled ----- Message ----- From: Maple Hudson, RN Sent: 12/09/2011 9:40 AM To: Louis Meckel, MD  Dr Arlyce Dice: Pt scheduled for colonoscopy 6/14. Previous colonoscopy 10/2002 with Dr. Virginia Rochester. Please review report. Is it okay for pt to have colon now or should he wait until 10/2012? Thank, Noami Bove      Pt notified to proceed with colonoscopy as planned.  12/09/2011

## 2011-12-21 ENCOUNTER — Emergency Department (HOSPITAL_COMMUNITY): Payer: BC Managed Care – PPO

## 2011-12-21 ENCOUNTER — Telehealth: Payer: Self-pay | Admitting: Internal Medicine

## 2011-12-21 ENCOUNTER — Encounter (HOSPITAL_COMMUNITY): Payer: Self-pay | Admitting: Emergency Medicine

## 2011-12-21 ENCOUNTER — Emergency Department (HOSPITAL_COMMUNITY)
Admission: EM | Admit: 2011-12-21 | Discharge: 2011-12-21 | Disposition: A | Discharge status: Home / Self Care | Payer: BC Managed Care – PPO | Attending: Emergency Medicine | Admitting: Emergency Medicine

## 2011-12-21 DIAGNOSIS — F17200 Nicotine dependence, unspecified, uncomplicated: Secondary | ICD-10-CM | POA: Insufficient documentation

## 2011-12-21 DIAGNOSIS — E785 Hyperlipidemia, unspecified: Secondary | ICD-10-CM | POA: Insufficient documentation

## 2011-12-21 DIAGNOSIS — Z79899 Other long term (current) drug therapy: Secondary | ICD-10-CM | POA: Insufficient documentation

## 2011-12-21 DIAGNOSIS — R079 Chest pain, unspecified: Secondary | ICD-10-CM

## 2011-12-21 DIAGNOSIS — G4733 Obstructive sleep apnea (adult) (pediatric): Secondary | ICD-10-CM | POA: Insufficient documentation

## 2011-12-21 LAB — DIFFERENTIAL
Basophils Relative: 0 % (ref 0–1)
Lymphs Abs: 1.2 10*3/uL (ref 0.7–4.0)
Monocytes Relative: 8 % (ref 3–12)
Neutro Abs: 4.8 10*3/uL (ref 1.7–7.7)
Neutrophils Relative %: 73 % (ref 43–77)

## 2011-12-21 LAB — CBC
Hemoglobin: 14.4 g/dL (ref 13.0–17.0)
RBC: 4.76 MIL/uL (ref 4.22–5.81)

## 2011-12-21 LAB — BASIC METABOLIC PANEL
BUN: 19 mg/dL (ref 6–23)
Chloride: 105 mEq/L (ref 96–112)
GFR calc Af Amer: >90 mL/min (ref >90)
Glucose, Bld: 124 mg/dL — ABNORMAL HIGH (ref 70–99)
Potassium: 3.6 mEq/L (ref 3.5–5.1)
Sodium: 143 mEq/L (ref 135–145)

## 2011-12-21 LAB — POCT I-STAT TROPONIN I

## 2011-12-21 NOTE — ED Notes (Signed)
Pt reports intermittant chest pain onset Memorial Day weekend. Pt is on left side and radiates to left arm. Pt denies any other symptoms.

## 2011-12-21 NOTE — Telephone Encounter (Signed)
ER CALL. Caller: Rob/Spouse; PCP: Birdie Sons; CB#: [phone]; ; ; Call regarding Chest Pain/Chest Discomfort; Pain is at upper Left side of Chest, onset 2 weeks. Chest Pain is constant, radiated down Left Arm on 6-11. Consulted w/ Tim Lair at office d/t ED dispo, agreement to send to ED, Dr Cato Mulligan will be notified.  Pt allergic to ASA. Pt will go to [facility] ED.

## 2011-12-21 NOTE — Discharge Instructions (Signed)
Follow up with your md this week or next °

## 2011-12-21 NOTE — ED Notes (Signed)
Pt states "pain is not bad, I can just tell that it's there." Pt states pain is radiating down left arm. Pt describes pain as an ache. Pain does not inc with palpation or movement. Pt denies n/v, diaphoresis, lightheadedness or SOB.

## 2011-12-21 NOTE — ED Notes (Signed)
Pt d/c home in NAD. Pt voiced understanding of d/c instructions. Pt ambulated with quick, steady gait  

## 2011-12-22 NOTE — ED Provider Notes (Cosign Needed)
History     CSN: [id number]  Arrival date & time 12/21/11  1111   First MD Initiated Contact with Patient 12/21/11 1421      Chief Complaint  Patient presents with  . Chest Pain    (Consider location/radiation/quality/duration/timing/severity/associated sxs/prior treatment) Patient is a 61 y.o. male presenting with chest pain. The history is provided by the patient (the pt complains of chest pain). No language interpreter was used.  Chest Pain The chest pain began less than 1 hour ago. Chest pain occurs constantly. The chest pain is improving. The pain is associated with stress. At its most intense, the pain is at 5/10. The pain is currently at 4/10. Chest pain is worsened by exertion. Pertinent negatives for primary symptoms include no fever, no fatigue, no cough and no abdominal pain.  Pertinent negatives for associated symptoms include no claudication. He tried nothing for the symptoms. Risk factors include smoking/tobacco exposure.  Pertinent negatives for past medical history include no seizures.     Past Medical History  Diagnosis Date  . Hyperlipidemia   . OSA (obstructive sleep apnea)     cpap    Past Surgical History  Procedure Date  . Finger tendon repair   . Knee arthroscopy   . Inguinal hernia repair 1973    left    Family History  Problem Relation Age of Onset  . Hyperlipidemia Father   . Deep vein thrombosis Sister   . Colon cancer Neg Hx   . Stomach cancer Neg Hx     History  Substance Use Topics  . Smoking status: Current Some Day Smoker -- 0.2 packs/day for 30 years    Types: Cigars  . Smokeless tobacco: Never Used  . Alcohol Use: 4.2 oz/week    7 Glasses of wine per week      Review of Systems  Constitutional: Negative for fever and fatigue.  HENT: Negative for congestion, sinus pressure and ear discharge.   Eyes: Negative for discharge.  Respiratory: Negative for cough.   Cardiovascular: Positive for chest pain. Negative for  claudication.  Gastrointestinal: Negative for abdominal pain and diarrhea.  Genitourinary: Negative for frequency and hematuria.  Musculoskeletal: Negative for back pain.  Skin: Negative for rash.  Neurological: Negative for seizures and headaches.  Hematological: Negative.   Psychiatric/Behavioral: Negative for hallucinations.    Allergies  Aspirin  Home Medications   Current Outpatient Rx  Name Route Sig Dispense Refill  . CLARITIN PO Oral Take 1 tablet by mouth daily.    . ADULT MULTIVITAMIN W/MINERALS CH Oral Take 1 tablet by mouth daily.    Marland Kitchen ROSUVASTATIN CALCIUM 10 MG PO TABS Oral Take 5 mg by mouth daily.      BP 133/87  Pulse 61  Temp 98.3 F (36.8 C) (Oral)  Resp 20  SpO2 100%  Physical Exam  Constitutional: He is oriented to person, place, and time. He appears well-developed.  HENT:  Head: Normocephalic and atraumatic.  Eyes: Conjunctivae and EOM are normal. No scleral icterus.  Neck: Neck supple. No thyromegaly present.  Cardiovascular: Normal rate and regular rhythm.  Exam reveals no gallop and no friction rub.   No murmur heard. Pulmonary/Chest: No stridor. He has no wheezes. He has no rales. He exhibits no tenderness.  Abdominal: He exhibits no distension. There is no tenderness. There is no rebound.  Musculoskeletal: Normal range of motion. He exhibits no edema.  Lymphadenopathy:    He has no cervical adenopathy.  Neurological: He is oriented to  person, place, and time. Coordination normal.  Skin: No rash noted. No erythema.  Psychiatric: He has a normal mood and affect. His behavior is normal.    ED Course  Procedures (including critical care time)  Labs Reviewed  BASIC METABOLIC PANEL - Abnormal; Notable for the following:    Glucose, Bld 124 (*)     GFR calc non Af Amer 89 (*)     All other components within normal limits  CBC  DIFFERENTIAL  POCT I-STAT TROPONIN I  LAB REPORT - SCANNED   Dg Chest 2 View  12/21/2011  *RADIOLOGY REPORT*   Clinical Data: Left chest pain.  CHEST - 2 VIEW  Comparison: 10/20/2009  Findings: Cardiac and mediastinal contours appear normal.  The lungs appear clear.  No pleural effusion is identified.  IMPRESSION:  No significant abnormality identified.  Original Report Authenticated By: Dellia Cloud, M.D.     1. Chest pain       MDM     Date: 01/19/2012  Rate: 79  Rhythm: normal sinus rhythm  QRS Axis: left  Intervals: normal  ST/T Wave abnormalities: normal  Conduction Disutrbances:none  Narrative Interpretation:   Old EKG Reviewed: none available       Benny Lennert, MD 12/22/11 6213  Benny Lennert, MD 01/19/12 1425

## 2011-12-23 ENCOUNTER — Encounter: Payer: Self-pay | Admitting: Gastroenterology

## 2011-12-23 ENCOUNTER — Ambulatory Visit (AMBULATORY_SURGERY_CENTER): Payer: BC Managed Care – PPO | Admitting: Gastroenterology

## 2011-12-23 VITALS — BP 148/85 | HR 76 | Temp 97.9°F | Resp 13 | Ht 72.0 in | Wt 200.0 lb

## 2011-12-23 DIAGNOSIS — K5730 Diverticulosis of large intestine without perforation or abscess without bleeding: Secondary | ICD-10-CM

## 2011-12-23 DIAGNOSIS — Z1211 Encounter for screening for malignant neoplasm of colon: Secondary | ICD-10-CM

## 2011-12-23 MED ORDER — SODIUM CHLORIDE 0.9 % IV SOLN: 500.0000 mL | INTRAVENOUS | Status: DC

## 2011-12-23 NOTE — Patient Instructions (Addendum)
Moderate diverticulosis  Recall colonoscopy in 10 years  YOU HAD AN ENDOSCOPIC PROCEDURE TODAY AT THE [facility]: Refer to the procedure report that was given to you for any specific questions about what was found during the examination.  If the procedure report does not answer your questions, please call your gastroenterologist to clarify.  If you requested that your care partner not be given the details of your procedure findings, then the procedure report has been included in a sealed envelope for you to review at your convenience later.  YOU SHOULD EXPECT: Some feelings of bloating in the abdomen. Passage of more gas than usual.  Walking can help get rid of the air that was put into your GI tract during the procedure and reduce the bloating. If you had a lower endoscopy (such as a colonoscopy or flexible sigmoidoscopy) you may notice spotting of blood in your stool or on the toilet paper. If you underwent a bowel prep for your procedure, then you may not have a normal bowel movement for a few days.  DIET: Your first meal following the procedure should be a light meal and then it is ok to progress to your normal diet.  A half-sandwich or bowl of soup is an example of a good first meal.  Heavy or fried foods are harder to digest and may make you feel nauseous or bloated.  Likewise meals heavy in dairy and vegetables can cause extra gas to form and this can also increase the bloating.  Drink plenty of fluids but you should avoid alcoholic beverages for 24 hours.  ACTIVITY: Your care partner should take you home directly after the procedure.  You should plan to take it easy, moving slowly for the rest of the day.  You can resume normal activity the day after the procedure however you should NOT DRIVE or use heavy machinery for 24 hours (because of the sedation medicines used during the test).    SYMPTOMS TO REPORT IMMEDIATELY: A gastroenterologist can be reached at any hour.  During normal  business hours, 8:30 AM to 5:00 PM Monday through Friday, call [phone].  After hours and on weekends, please call the GI answering service at [phone] who will take a message and have the physician on call contact you.   Following lower endoscopy (colonoscopy or flexible sigmoidoscopy):  Excessive amounts of blood in the stool  Significant tenderness or worsening of abdominal pains  Swelling of the abdomen that is new, acute  Fever of 100F or higher  FOLLOW UP: If any biopsies were taken you will be contacted by phone or by letter within the next 1-3 weeks.  Call your gastroenterologist if you have not heard about the biopsies in 3 weeks.  Our staff will call the home number listed on your records the next business day following your procedure to check on you and address any questions or concerns that you may have at that time regarding the information given to you following your procedure. This is a courtesy call and so if there is no answer at the home number and we have not heard from you through the emergency physician on call, we will assume that you have returned to your regular daily activities without incident.  SIGNATURES/CONFIDENTIALITY: You and/or your care partner have signed paperwork which will be entered into your electronic medical record.  These signatures attest to the fact that that the information above on your After Visit Summary has been reviewed and is  understood.  Full responsibility of the confidentiality of this discharge information lies with you and/or your care-partner.     Diverticulosis Diverticulosis is a common condition that develops when small pouches (diverticula) form in the wall of the colon. The risk of diverticulosis increases with age. It happens more often in people who eat a low-fiber diet. Most individuals with diverticulosis have no symptoms. Those individuals with symptoms usually experience abdominal pain, constipation, or loose stools  (diarrhea). HOME CARE INSTRUCTIONS   Increase the amount of fiber in your diet as directed by your caregiver or dietician. This may reduce symptoms of diverticulosis.   Your caregiver may recommend taking a dietary fiber supplement.   Drink at least 6 to 8 glasses of water each day to prevent constipation.   Try not to strain when you have a bowel movement.   Your caregiver may recommend avoiding nuts and seeds to prevent complications, although this is still an uncertain benefit.   Only take over-the-counter or prescription medicines for pain, discomfort, or fever as directed by your caregiver.  FOODS WITH HIGH FIBER CONTENT INCLUDE:  Fruits. Apple, peach, pear, tangerine, raisins, prunes.   Vegetables. Brussels sprouts, asparagus, broccoli, cabbage, carrot, cauliflower, romaine lettuce, spinach, summer squash, tomato, winter squash, zucchini.   Starchy Vegetables. Baked beans, kidney beans, lima beans, split peas, lentils, potatoes (with skin).   Grains. Whole wheat bread, brown rice, bran flake cereal, plain oatmeal, white rice, shredded wheat, bran muffins.  SEEK IMMEDIATE MEDICAL CARE IF:   You develop increasing pain or severe bloating.   You have an oral temperature above 102 F (38.9 C), not controlled by medicine.   You develop vomiting or bowel movements that are bloody or black.  Document Released: 03/24/2004 Document Revised: 06/16/2011 Document Reviewed: 11/25/2009 [facility] Patient Information 2012 [place], [place].

## 2011-12-23 NOTE — Progress Notes (Signed)
Patient did not experience any of the following events: a burn prior to discharge; a fall within the facility; wrong site/side/patient/procedure/implant event; or a hospital transfer or hospital admission upon discharge from the facility. (G8907) Patient did not have preoperative order for IV antibiotic SSI prophylaxis. (G8918)  

## 2011-12-23 NOTE — Progress Notes (Signed)
Pressure applied to the abdomen to reach cecum. 

## 2011-12-23 NOTE — Op Note (Signed)
[facility] [address] [place], [place]  [address]  COLONOSCOPY PROCEDURE REPORT  PATIENT:  Paul, Chang  MR#:  [id number] BIRTHDATE:  [birth year], 60 yrs. old  GENDER:  male ENDOSCOPIST:  Dmitri Pettigrew. Arlyce Dice, MD REF. BY:  Birdie Sons, M.D. PROCEDURE DATE:  12/23/2011 PROCEDURE:  Diagnostic Colonoscopy ASA CLASS:  Class II INDICATIONS:  Routine Risk Screening MEDICATIONS:   MAC sedation, administered by CRNA propofol 300mg IV  DESCRIPTION OF PROCEDURE:   After the risks benefits and alternatives of the procedure were thoroughly explained, informed consent was obtained.  Digital rectal exam was performed and revealed no abnormalities.   The LB [id number]-[id number] endoscope was introduced through the anus and advanced to the cecum, which was identified by both the appendix and ileocecal valve, without limitations.  The quality of the prep was excellent, using MoviPrep.  The instrument was then slowly withdrawn as the colon was fully examined. <<PROCEDUREIMAGES>>  FINDINGS:  Moderate diverticulosis was found in the sigmoid colon (see image5).  This was otherwise a normal examination of the colon (see image4, image3, and image7).   Retroflexed views in the rectum revealed no abnormalities.    The time to cecum =  1) 7.25 minutes. The scope was then withdrawn in  1) 6.75  minutes from the cecum and the procedure completed. COMPLICATIONS:  None ENDOSCOPIC IMPRESSION: 1) Moderate diverticulosis in the sigmoid colon 2) Otherwise normal examination RECOMMENDATIONS: 1) Continue current colorectal screening recommendations for "routine risk" patients with a repeat colonoscopy in 10 years. REPEAT EXAM:  In 10 year(s) for Colonoscopy.  ______________________________ Barbette Hair. Arlyce Dice, MD  CC:  n. eSIGNED:   Barbette Hair. Tibor Lemmons at 12/23/2011 11:03 AM  Paul Chang, [id number]

## 2011-12-26 ENCOUNTER — Telehealth: Payer: Self-pay

## 2011-12-26 NOTE — Telephone Encounter (Signed)
  Follow up Call-  Call back number 12/23/2011  Post procedure Call Back phone  # [phone]  Permission to leave phone message Yes     Patient questions:  Do you have a fever, pain , or abdominal swelling? no Pain Score  0 *  Have you tolerated food without any problems? yes  Have you been able to return to your normal activities? yes  Do you have any questions about your discharge instructions: Diet   no Medications  no Follow up visit  no  Do you have questions or concerns about your Care? no  Actions: * If pain score is 4 or above: No action needed, pain <4.

## 2012-07-18 ENCOUNTER — Ambulatory Visit (INDEPENDENT_AMBULATORY_CARE_PROVIDER_SITE_OTHER): Payer: BC Managed Care – PPO | Admitting: Family Medicine

## 2012-07-18 ENCOUNTER — Encounter: Payer: Self-pay | Admitting: Family Medicine

## 2012-07-18 VITALS — BP 120/82 | HR 100 | Temp 98.7°F | Wt 208.0 lb

## 2012-07-18 DIAGNOSIS — J069 Acute upper respiratory infection, unspecified: Secondary | ICD-10-CM

## 2012-07-18 NOTE — Patient Instructions (Signed)

## 2012-07-18 NOTE — Progress Notes (Signed)
Chief Complaint  Patient presents with  . Sinusitis    facial pain    HPI:  Acute visit for sinus congestion: -started: 5 days -symptoms:nasal congestion, sore throat, cough, maxillary sinus pain bilat for 2 days -denies:fever, SOB, NVD, strep, flu or mono exposure -has tried: nasal saline since yesterday -sick contacts: none known -Hx of: OSA - uses CPAP with humidified air   ROS: See pertinent positives and negatives per HPI.  Past Medical History  Diagnosis Date  . Hyperlipidemia   . OSA (obstructive sleep apnea)     cpap    Family History  Problem Relation Age of Onset  . Hyperlipidemia Father   . Deep vein thrombosis Sister   . Colon cancer Neg Hx   . Stomach cancer Neg Hx     History   Social History  . Marital Status: Married    Spouse Name: N/A    Number of Children: N/A  . Years of Education: N/A   Social History Main Topics  . Smoking status: Current Some Day Smoker -- 0.2 packs/day for 30 years    Types: Cigars  . Smokeless tobacco: Never Used  . Alcohol Use: 4.2 oz/week    7 Glasses of wine per week  . Drug Use: No  . Sexually Active: None   Other Topics Concern  . None   Social History Narrative  . None    Current outpatient prescriptions:Loratadine (CLARITIN PO), Take 1 tablet by mouth daily., Disp: , Rfl: ;  rosuvastatin (CRESTOR) 10 MG tablet, Take 5 mg by mouth daily., Disp: , Rfl: ;  MOVIPREP 100 G SOLR, , Disp: , Rfl: ;  Multiple Vitamin (MULTIVITAMIN WITH MINERALS) TABS, Take 1 tablet by mouth daily., Disp: , Rfl: ;  traZODone (DESYREL) 50 MG tablet, Take 1 tablet by mouth At bedtime as needed., Disp: , Rfl:   EXAM:  Filed Vitals:   07/18/12 0850  BP: 120/82  Pulse: 100  Temp: 98.7 F (37.1 C)    There is no height on file to calculate BMI.  GENERAL: vitals reviewed and listed above, alert, oriented, appears well hydrated and in no acute distress  HEENT: atraumatic, conjunttiva clear, no obvious abnormalities on inspection  of external nose and ears, normal appearance of ear canals and TMs, clear nasal congestion, mild post oropharyngeal erythema with PND, no tonsillar edema or exudate, no sinus TTP  NECK: no obvious masses on inspection  LUNGS: clear to auscultation bilaterally, no wheezes, rales or rhonchi, good air movement  CV: HRRR, no peripheral edema  MS: moves all extremities without noticeable abnormality  PSYCH: pleasant and cooperative, no obvious depression or anxiety  ASSESSMENT AND PLAN:  Discussed the following assessment and plan:  1. Upper respiratory infection    -likely viral, supportive care - call if not improving as expected by next week, return precuations -Patient advised to return or notify a doctor immediately if symptoms worsen or persist or new concerns arise.  Patient Instructions  INSTRUCTIONS FOR UPPER RESPIRATORY INFECTION:  -plenty of rest and fluids  -nasal saline wash 2-3 times daily (use prepackaged nasal saline or bottled/distilled water if making your own)   -can use sinex nasal spray for drainage and nasal congestion - but do NOT use longer then 3-4 days  -can use tylenol or ibuprofen as directed for aches and sorethroat  -in the winter time, using a humidifier at night is helpful (please follow cleaning instructions)  -if you are taking a cough medication - use only  as directed, may also try a teaspoon of honey to coat the throat and throat lozenges  -for sore throat, salt water gargles can help  -follow up if you have fevers, facial pain, tooth pain, difficulty breathing or are worsening or not getting better in 5-7 days      Ervan Heber R.

## 2012-11-28 IMAGING — CR DG CHEST 2V
2 series · 2 of 2 positions shown · non-contrast
Comparison: 10/20/2009

CLINICAL DATA: Left chest pain.

CHEST - 2 VIEW

[w chest pa]
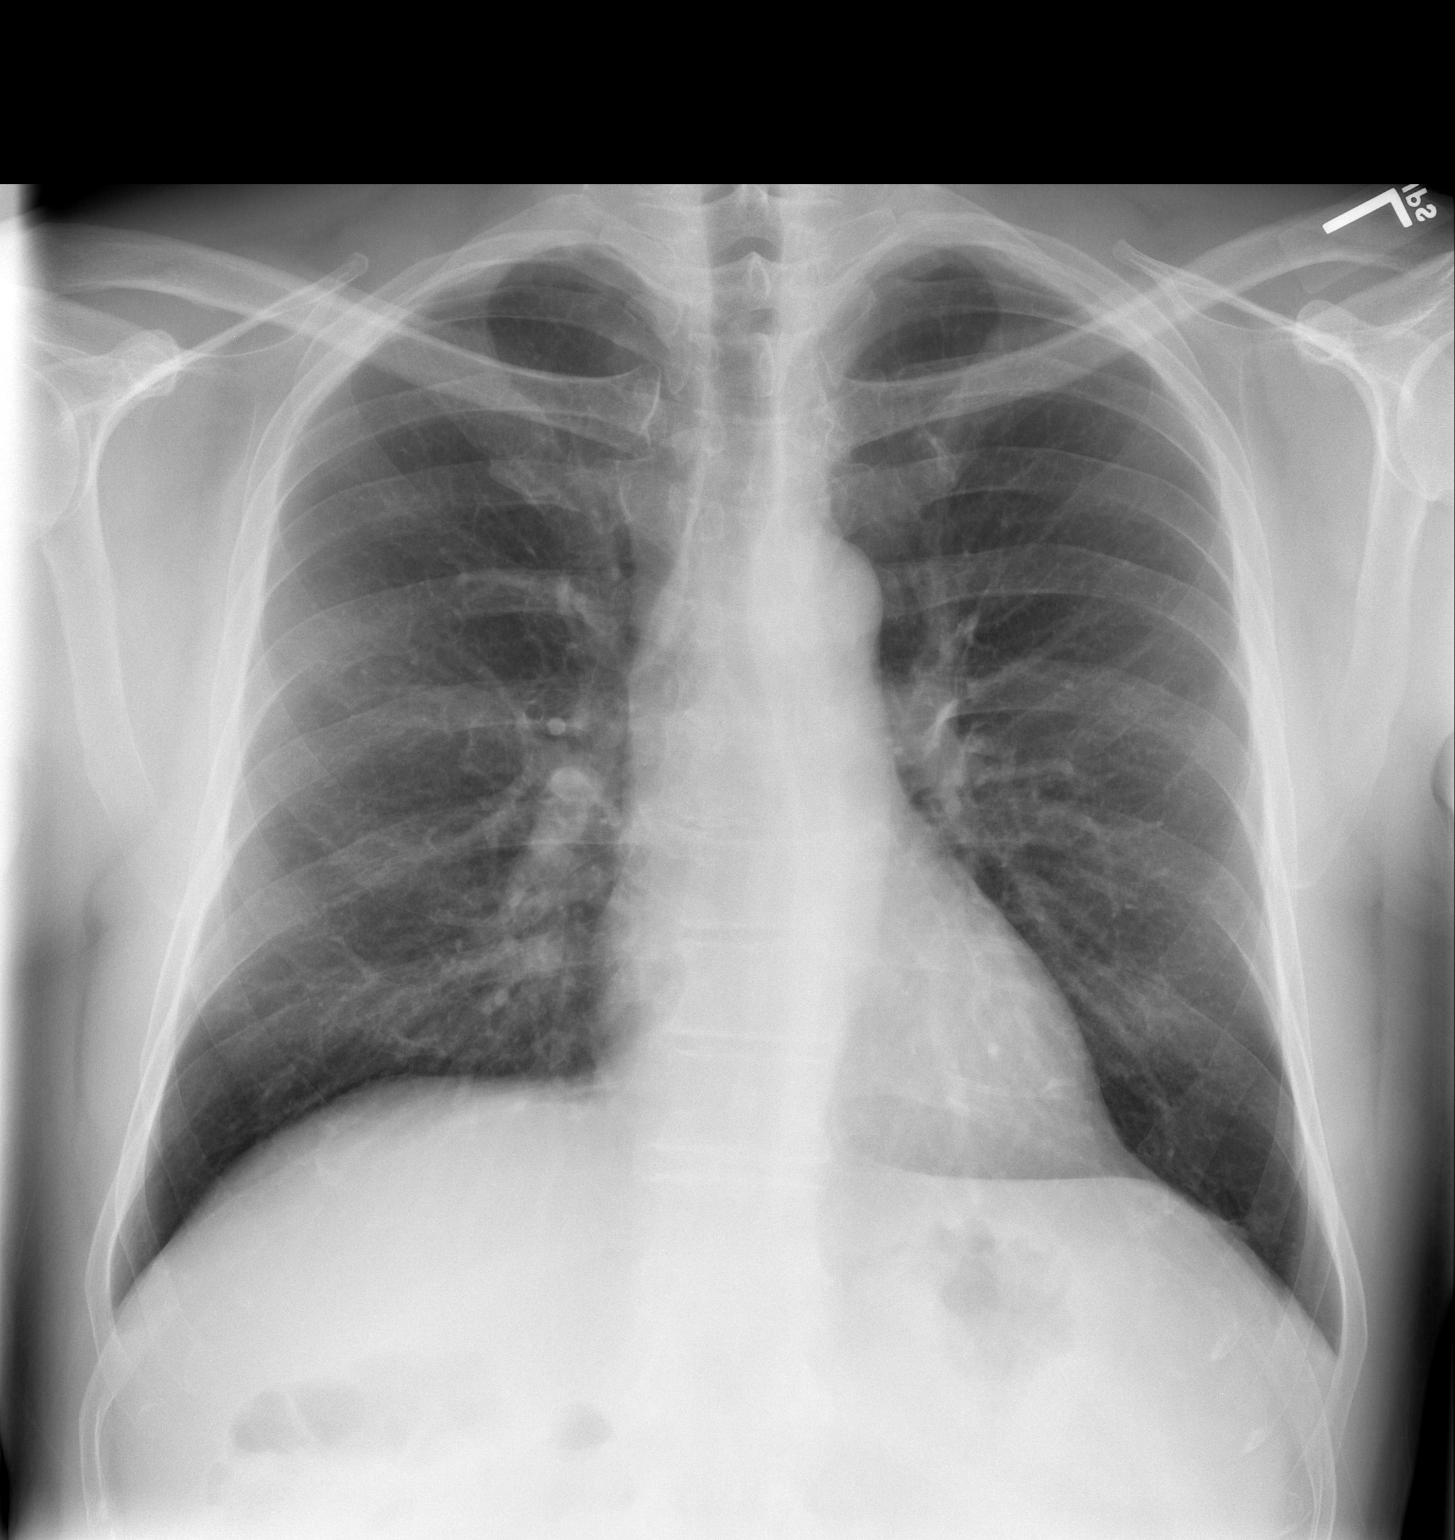

[w chest lat]
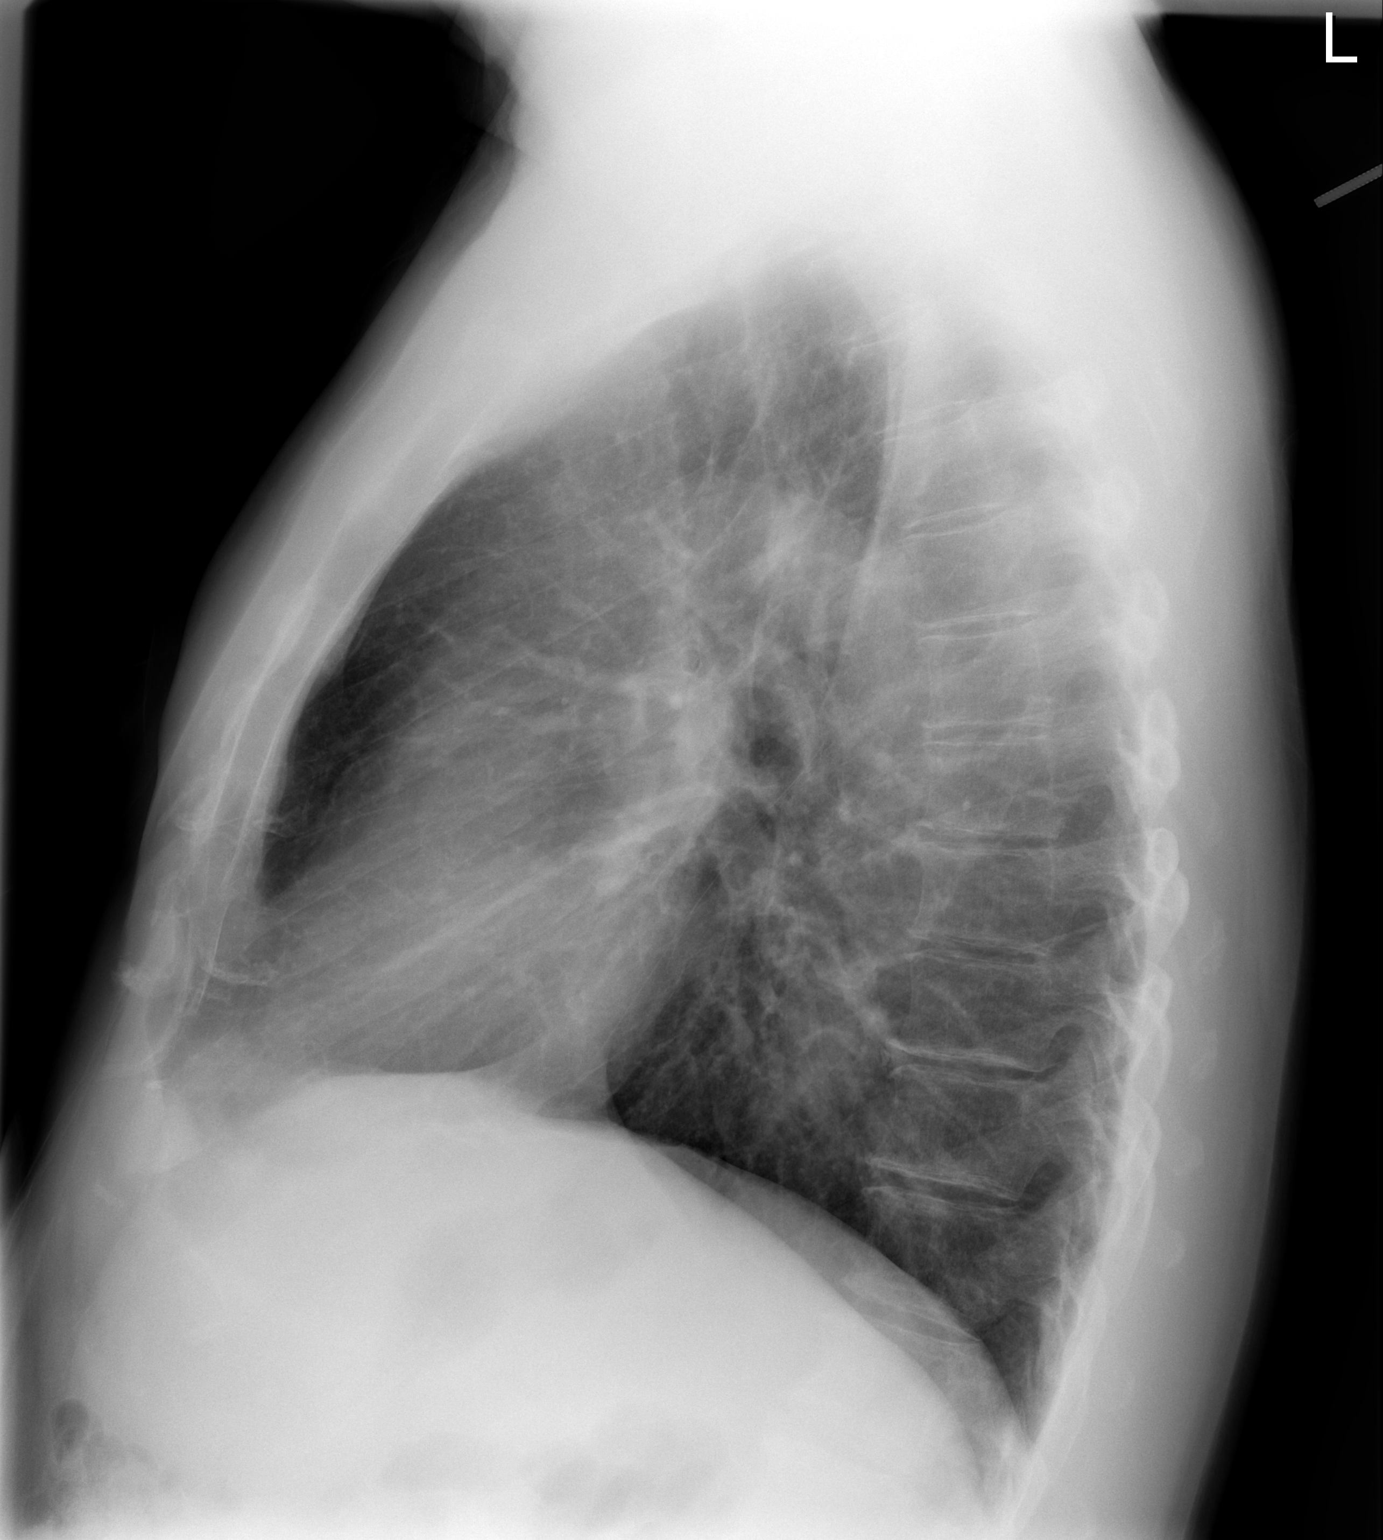

[2 of 2 positions shown; findings below may reference images not displayed]

FINDINGS: Cardiac and mediastinal contours appear normal.

The lungs appear clear.

No pleural effusion is identified.
IMPRESSION: No significant abnormality identified.

## 2013-08-04 ENCOUNTER — Telehealth: Payer: Self-pay | Admitting: Internal Medicine

## 2013-08-04 NOTE — Telephone Encounter (Signed)
[facility] requesting refill of rosuvastatin (CRESTOR) 10 MG tablet

## 2013-08-05 NOTE — Telephone Encounter (Signed)
Pt wife is aware must have OV. Wife will have husband make OV

## 2013-08-05 NOTE — Telephone Encounter (Signed)
Pt needs OV, hasn't been seen since 2013

## 2013-09-19 ENCOUNTER — Other Ambulatory Visit (INDEPENDENT_AMBULATORY_CARE_PROVIDER_SITE_OTHER): Payer: BC Managed Care – PPO

## 2013-09-19 DIAGNOSIS — Z0000 Encounter for general adult medical examination without abnormal findings: Secondary | ICD-10-CM

## 2013-09-19 LAB — BASIC METABOLIC PANEL
BUN: 20 mg/dL (ref 6–23)
CALCIUM: 9.8 mg/dL (ref 8.4–10.5)
CHLORIDE: 108 meq/L (ref 96–112)
CO2: 28 meq/L (ref 19–32)
CREATININE: 1 mg/dL (ref 0.4–1.5)
GFR: 79.05 mL/min (ref >60.00)
Glucose, Bld: 117 mg/dL — ABNORMAL HIGH (ref 70–99)
Potassium: 4.2 mEq/L (ref 3.5–5.1)
Sodium: 143 mEq/L (ref 135–145)

## 2013-09-19 LAB — LIPID PANEL
CHOLESTEROL: 198 mg/dL (ref 0–200)
HDL: 58.2 mg/dL (ref >39.00)
LDL Cholesterol: 125 mg/dL — ABNORMAL HIGH (ref 0–99)
TRIGLYCERIDES: 73 mg/dL (ref 0.0–149.0)
Total CHOL/HDL Ratio: 3
VLDL: 14.6 mg/dL (ref 0.0–40.0)

## 2013-09-19 LAB — POCT URINALYSIS DIPSTICK
BILIRUBIN UA: NEGATIVE
GLUCOSE UA: NEGATIVE
KETONES UA: NEGATIVE
Leukocytes, UA: NEGATIVE
NITRITE UA: NEGATIVE
Protein, UA: NEGATIVE
RBC UA: NEGATIVE
SPEC GRAV UA: 1.015
Urobilinogen, UA: 0.2
pH, UA: 7

## 2013-09-19 LAB — CBC WITH DIFFERENTIAL/PLATELET
BASOS ABS: 0 10*3/uL (ref 0.0–0.1)
Basophils Relative: 0.4 % (ref 0.0–3.0)
EOS ABS: 0.1 10*3/uL (ref 0.0–0.7)
Eosinophils Relative: 2.6 % (ref 0.0–5.0)
HCT: 43.5 % (ref 39.0–52.0)
Hemoglobin: 14.9 g/dL (ref 13.0–17.0)
Lymphocytes Relative: 27 % (ref 12.0–46.0)
Lymphs Abs: 1.5 10*3/uL (ref 0.7–4.0)
MCHC: 34.1 g/dL (ref 30.0–36.0)
MCV: 89.9 fl (ref 78.0–100.0)
MONO ABS: 0.5 10*3/uL (ref 0.1–1.0)
Monocytes Relative: 9.9 % (ref 3.0–12.0)
NEUTROS PCT: 60.1 % (ref 43.0–77.0)
Neutro Abs: 3.3 10*3/uL (ref 1.4–7.7)
PLATELETS: 220 10*3/uL (ref 150.0–400.0)
RBC: 4.84 Mil/uL (ref 4.22–5.81)
RDW: 13 % (ref 11.5–14.6)
WBC: 5.5 10*3/uL (ref 4.5–10.5)

## 2013-09-19 LAB — HEPATIC FUNCTION PANEL
ALT: 37 U/L (ref 0–53)
AST: 33 U/L (ref 0–37)
Albumin: 4.5 g/dL (ref 3.5–5.2)
Alkaline Phosphatase: 54 U/L (ref 39–117)
BILIRUBIN DIRECT: 0.2 mg/dL (ref 0.0–0.3)
BILIRUBIN TOTAL: 1.2 mg/dL (ref 0.3–1.2)
Total Protein: 6.8 g/dL (ref 6.0–8.3)

## 2013-09-19 LAB — TSH: TSH: 1.96 u[IU]/mL (ref 0.35–5.50)

## 2013-09-19 LAB — PSA: PSA: 1.7 ng/mL (ref 0.10–4.00)

## 2013-10-09 ENCOUNTER — Ambulatory Visit (INDEPENDENT_AMBULATORY_CARE_PROVIDER_SITE_OTHER): Payer: BC Managed Care – PPO | Admitting: Internal Medicine

## 2013-10-09 ENCOUNTER — Encounter: Payer: Self-pay | Admitting: Internal Medicine

## 2013-10-09 VITALS — BP 130/80 | HR 80 | Temp 98.8°F | Ht 72.0 in | Wt 213.0 lb

## 2013-10-09 DIAGNOSIS — Z0000 Encounter for general adult medical examination without abnormal findings: Secondary | ICD-10-CM

## 2013-10-09 NOTE — Progress Notes (Signed)
Pre visit review using our clinic review tool, if applicable. No additional management support is needed unless otherwise documented below in the visit note. 

## 2013-10-09 NOTE — Progress Notes (Signed)
cpx  Past Medical History  Diagnosis Date  . Hyperlipidemia   . OSA (obstructive sleep apnea)     cpap    History   Social History  . Marital Status: Married    Spouse Name: N/A    Number of Children: N/A  . Years of Education: N/A   Occupational History  . Not on file.   Social History Main Topics  . Smoking status: Current Some Day Smoker -- 0.25 packs/day for 30 years    Types: Cigars  . Smokeless tobacco: Never Used  . Alcohol Use: 4.2 oz/week    7 Glasses of wine per week  . Drug Use: No  . Sexual Activity: Not on file   Other Topics Concern  . Not on file   Social History Narrative  . No narrative on file    Past Surgical History  Procedure Laterality Date  . Finger tendon repair    . Knee arthroscopy    . Inguinal hernia repair  1973    left    Family History  Problem Relation Age of Onset  . Hyperlipidemia Father   . Deep vein thrombosis Sister   . Colon cancer Neg Hx   . Stomach cancer Neg Hx     Allergies  Allergen Reactions  . Aspirin Swelling    Current Outpatient Prescriptions on File Prior to Visit  Medication Sig Dispense Refill  . rosuvastatin (CRESTOR) 10 MG tablet Take 5 mg by mouth daily.       No current facility-administered medications on file prior to visit.     patient denies chest pain, shortness of breath, orthopnea. Denies lower extremity edema, abdominal pain, change in appetite, change in bowel movements. Patient denies rashes, musculoskeletal complaints. No other specific complaints in a complete review of systems.   BP 130/80  Pulse 80  Temp(Src) 98.8 F (37.1 C) (Oral)  Ht 6' (1.829 m)  Wt 213 lb (96.616 kg)  BMI 28.88 kg/m2 Well-developed male in no acute distress. HEENT exam atraumatic, normocephalic, extraocular muscles are intact. Conjunctivae are pink without exudate. Neck is supple without lymphadenopathy, thyromegaly, jugular venous distention. Chest is clear to auscultation without increased work of  breathing. Cardiac exam S1-S2 are regular. The PMI is normal. No significant murmurs or gallops. Abdominal exam active bowel sounds, soft, nontender. No abdominal bruits. Extremities no clubbing cyanosis or edema. Peripheral pulses are normal without bruits. Neurologic exam alert and oriented without any motor or sensory deficits. Rectal exam normal tone prostate normal size without masses or asymmetry.  Well visit- health maint UTD

## 2014-01-07 ENCOUNTER — Telehealth: Payer: Self-pay | Admitting: Internal Medicine

## 2014-01-07 NOTE — Telephone Encounter (Signed)
Ppt needs new rx rosuvastatin (CRESTOR) 10 MG tablet 90 day To be sent to [facility] (this is new pharm for pt)

## 2014-01-08 MED ORDER — ROSUVASTATIN CALCIUM 10 MG PO TABS: 10.0000 mg | ORAL_TABLET | Freq: Every day | ORAL | Status: DC

## 2014-01-08 NOTE — Telephone Encounter (Signed)
rx sent in electronically 

## 2014-01-31 ENCOUNTER — Telehealth: Payer: Self-pay | Admitting: Internal Medicine

## 2014-01-31 MED ORDER — ROSUVASTATIN CALCIUM 10 MG PO TABS: 10.0000 mg | ORAL_TABLET | Freq: Every day | ORAL | Status: AC

## 2014-01-31 MED ORDER — ROSUVASTATIN CALCIUM 10 MG PO TABS: 10.0000 mg | ORAL_TABLET | Freq: Every day | ORAL | Status: DC

## 2014-01-31 NOTE — Telephone Encounter (Signed)
rx sent in electronically 

## 2014-01-31 NOTE — Telephone Encounter (Signed)
[facility] - [place], [place] - [address] is requesting 90 day re-fill on rosuvastatin (CRESTOR) 10 MG tablet ° °

## 2019-04-19 ENCOUNTER — Other Ambulatory Visit: Payer: Self-pay

## 2019-04-19 DIAGNOSIS — Z20822 Contact with and (suspected) exposure to covid-19: Secondary | ICD-10-CM

## 2019-04-21 LAB — NOVEL CORONAVIRUS, NAA: SARS-CoV-2, NAA: NOT DETECTED

## 2019-04-24 ENCOUNTER — Other Ambulatory Visit: Payer: Self-pay

## 2019-04-24 DIAGNOSIS — Z20822 Contact with and (suspected) exposure to covid-19: Secondary | ICD-10-CM

## 2019-04-25 LAB — NOVEL CORONAVIRUS, NAA: SARS-CoV-2, NAA: NOT DETECTED

## 2019-12-24 ENCOUNTER — Encounter: Payer: Self-pay | Admitting: Orthopaedic Surgery

## 2019-12-24 ENCOUNTER — Other Ambulatory Visit: Payer: Self-pay

## 2019-12-24 ENCOUNTER — Ambulatory Visit (INDEPENDENT_AMBULATORY_CARE_PROVIDER_SITE_OTHER): Payer: PRIVATE HEALTH INSURANCE

## 2019-12-24 ENCOUNTER — Ambulatory Visit (INDEPENDENT_AMBULATORY_CARE_PROVIDER_SITE_OTHER): Payer: PRIVATE HEALTH INSURANCE | Admitting: Orthopaedic Surgery

## 2019-12-24 VITALS — Ht 72.0 in | Wt 210.0 lb

## 2019-12-24 DIAGNOSIS — M25561 Pain in right knee: Secondary | ICD-10-CM | POA: Diagnosis not present

## 2019-12-24 NOTE — Progress Notes (Signed)
Office Visit Note   Patient: Paul Chang           Date of Birth: [birth year]           MRN: [id number] Visit Date: 12/24/2019              Requested by: No referring provider defined for this encounter. PCP: Lindley Magnus, MD (Inactive)   Assessment & Plan: Visit Diagnoses:  1. Acute pain of right knee     Plan: Impression is partial quadriceps tear.  Fortunately his extensor mechanism and strength are still intact.  We will treat this nonsurgically.  We will limit his range of motion with a Bledsoe brace from 0 to 30 degrees.  He may ambulate as tolerated within the brace.  Activity restrictions and limitations reviewed in detail.  Recheck in 6 weeks.  Anticipate starting physical therapy at that time.  Follow-Up Instructions: Return in about 6 weeks (around 02/04/2020).   Orders:  Orders Placed This Encounter  Procedures  . XR KNEE 3 VIEW RIGHT   No orders of the defined types were placed in this encounter.     Procedures: No procedures performed   Clinical Data: No additional findings.   Subjective: Chief Complaint  Patient presents with  . Right Knee - Pain    DOI 12-22-2019    [name] is a very pleasant 69 year old gentleman comes in for evaluation of acute right knee injury from this past Sunday.  He was hiking with his grandkids when he missed a step and his leg bent underneath him and he felt a tearing sensation.  He mainly has pain at the distal anterior thigh.  He has some difficulty with walking.  Not taking any pain medications.  His wife is with him today in the office.  He has noticed swelling and bruising as well.   Review of Systems  Constitutional: Negative.   All other systems reviewed and are negative.    Objective: Vital Signs: Ht 6' (1.829 m)   Wt 210 lb (95.3 kg)   BMI 28.48 kg/m   Physical Exam Vitals and nursing note reviewed.  Constitutional:      Appearance: He is well-developed.  HENT:     Head: Normocephalic and  atraumatic.  Eyes:     Pupils: Pupils are equal, round, and reactive to light.  Pulmonary:     Effort: Pulmonary effort is normal.  Abdominal:     Palpations: Abdomen is soft.  Musculoskeletal:        General: Normal range of motion.     Cervical back: Neck supple.  Skin:    General: Skin is warm.  Neurological:     Mental Status: He is alert and oriented to person, place, and time.  Psychiatric:        Behavior: Behavior normal.        Thought Content: Thought content normal.        Judgment: Judgment normal.     Ortho Exam Right knee and thigh show moderate bruising near the distal portion of the VMO.  Patella tracking is normal.  He does have increased pain with passive stretch of the knee joint over the distal quadriceps.  He is able to maintain straight leg against resistance and gravity.  No extensor lag.  Neurovascular intact.  Moderate joint effusion. Specialty Comments:  No specialty comments available.  Imaging: XR KNEE 3 VIEW RIGHT  Result Date: 12/24/2019 Superior enthesophyte of the patella with small ossifications superior to  this.    PMFS History: Patient Active Problem List   Diagnosis Date Noted  . OBSTRUCTIVE SLEEP APNEA 12/16/2008  . HYPERLIPIDEMIA 03/21/2007  . GILBERT'S SYNDROME 02/15/2007   Past Medical History:  Diagnosis Date  . Hyperlipidemia   . OSA (obstructive sleep apnea)    cpap    Family History  Problem Relation Age of Onset  . Hyperlipidemia Father   . Deep vein thrombosis Sister   . Colon cancer Neg Hx   . Stomach cancer Neg Hx     Past Surgical History:  Procedure Laterality Date  . FINGER TENDON REPAIR    . [place]   left  . KNEE ARTHROSCOPY     Social History   Occupational History  . Not on file  Tobacco Use  . Smoking status: Current Some Day Smoker    Packs/day: 0.25    Years: 30.00    Pack years: 7.50    Types: Cigars  . Smokeless tobacco: Never Used  Substance and Sexual Activity  .  Alcohol use: Yes    Alcohol/week: 7.0 standard drinks    Types: 7 Glasses of wine per week  . Drug use: No  . Sexual activity: Not on file

## 2020-02-06 ENCOUNTER — Ambulatory Visit (INDEPENDENT_AMBULATORY_CARE_PROVIDER_SITE_OTHER): Payer: PRIVATE HEALTH INSURANCE | Admitting: Orthopaedic Surgery

## 2020-02-06 ENCOUNTER — Encounter: Payer: Self-pay | Admitting: Orthopaedic Surgery

## 2020-02-06 DIAGNOSIS — S76111A Strain of right quadriceps muscle, fascia and tendon, initial encounter: Secondary | ICD-10-CM

## 2020-02-06 NOTE — Progress Notes (Signed)
Patient ID: Paul Chang, male   DOB: June 03, 1951, 69 y.o.   MRN: [id number]  Mohannad returns today for follow-up of his partial quadriceps rupture.  He states that he is doing much better.  He wore the brace for about 4 weeks.  His patella catches at times and he does feel some tightness with passive stretch but otherwise he has no real complaints.  He is walking much better now and he is able to negotiate stairs without much difficulty.  His range of motion and strength have also improved significantly.  He is ambulating at a regular pace.  We reviewed activity restrictions going forward.  I would like to recheck him in 6 weeks.  I made a referral to outpatient PT for home exercise program.

## 2020-03-19 ENCOUNTER — Encounter: Payer: Self-pay | Admitting: Orthopaedic Surgery

## 2020-03-19 ENCOUNTER — Ambulatory Visit (INDEPENDENT_AMBULATORY_CARE_PROVIDER_SITE_OTHER): Payer: PRIVATE HEALTH INSURANCE | Admitting: Orthopaedic Surgery

## 2020-03-19 VITALS — Ht 72.0 in | Wt 210.0 lb

## 2020-03-19 DIAGNOSIS — S76111A Strain of right quadriceps muscle, fascia and tendon, initial encounter: Secondary | ICD-10-CM | POA: Diagnosis not present

## 2020-03-19 NOTE — Progress Notes (Signed)
Office Visit Note   Patient: Paul Chang           Date of Birth: [birth year]           MRN: [id number] Visit Date: 03/19/2020              Requested by: Lindley Magnus, MD [address] [place],  [place] [address] PCP: Lindley Magnus, MD   Assessment & Plan: Visit Diagnoses:  1. Rupture of right quadriceps muscle, initial encounter     Plan: Impression is 12 weeks status post right partial quad rupture.  The patient continues to progress.  He will continue with his home exercise program especially with strengthening exercises.  He may advance with activity as tolerated but has been instructed to not run until he 6 months out from injury.  He will follow up with [place] as needed.  Follow-Up Instructions: Return if symptoms worsen or fail to improve.   Orders:  No orders of the defined types were placed in this encounter.  No orders of the defined types were placed in this encounter.     Procedures: No procedures performed   Clinical Data: No additional findings.   Subjective: Chief Complaint  Patient presents with  . Right Leg - Follow-up    Partial quadriceps rupture    HPI patient is a pleasant 69 year old gentleman who comes in today 12 weeks out right partial quad rupture 12/22/2019.  He has been doing well.  He has no pain.  He went to what sounds like one initial physical therapy session where he was provided with a home exercise program.  He notes that he has been doing these at home.  He has regained near full range of motion and strength.  Review of Systems as detailed in HPI.  All others reviewed and are negative.   Objective: Vital Signs: Ht 6' (1.829 m)   Wt 210 lb (95.3 kg)   BMI 28.48 kg/m   Physical Exam well-developed and well-nourished gentleman in no acute distress.  Alert and oriented x3.  Ortho Exam examination of the right quad reveals a small palpable but nontender mass to the distal quadriceps.  4 out of 5 strength with resisted  straight leg raise.  Range of motion 0 to 120 degrees.  He is neurovascular intact distally.  Specialty Comments:  No specialty comments available.  Imaging: No new imaging   PMFS History: Patient Active Problem List   Diagnosis Date Noted  . Rupture of right quadriceps muscle 02/06/2020  . OBSTRUCTIVE SLEEP APNEA 12/16/2008  . HYPERLIPIDEMIA 03/21/2007  . GILBERT'S SYNDROME 02/15/2007   Past Medical History:  Diagnosis Date  . Hyperlipidemia   . OSA (obstructive sleep apnea)    cpap    Family History  Problem Relation Age of Onset  . Hyperlipidemia Father   . Deep vein thrombosis Sister   . Colon cancer Neg Hx   . Stomach cancer Neg Hx     Past Surgical History:  Procedure Laterality Date  . FINGER TENDON REPAIR    . INGUINAL HERNIA REPAIR  1973   left  . KNEE ARTHROSCOPY     Social History   Occupational History  . Not on file  Tobacco Use  . Smoking status: Current Some Day Smoker    Packs/day: 0.25    Years: 30.00    Pack years: 7.50    Types: Cigars  . Smokeless tobacco: Never Used  Substance and Sexual Activity  . Alcohol  use: Yes    Alcohol/week: 7.0 standard drinks    Types: 7 Glasses of wine per week  . Drug use: No  . Sexual activity: Not on file

## 2020-08-08 DIAGNOSIS — Z20822 Contact with and (suspected) exposure to covid-19: Secondary | ICD-10-CM | POA: Diagnosis not present

## 2021-01-06 DIAGNOSIS — I10 Essential (primary) hypertension: Secondary | ICD-10-CM | POA: Diagnosis not present

## 2021-01-06 DIAGNOSIS — G4733 Obstructive sleep apnea (adult) (pediatric): Secondary | ICD-10-CM | POA: Diagnosis not present

## 2021-01-06 DIAGNOSIS — Z79899 Other long term (current) drug therapy: Secondary | ICD-10-CM | POA: Diagnosis not present

## 2021-03-11 DIAGNOSIS — H2513 Age-related nuclear cataract, bilateral: Secondary | ICD-10-CM | POA: Diagnosis not present

## 2021-03-29 DIAGNOSIS — H40013 Open angle with borderline findings, low risk, bilateral: Secondary | ICD-10-CM | POA: Diagnosis not present

## 2021-03-29 DIAGNOSIS — H2513 Age-related nuclear cataract, bilateral: Secondary | ICD-10-CM | POA: Diagnosis not present

## 2021-09-13 DIAGNOSIS — H2513 Age-related nuclear cataract, bilateral: Secondary | ICD-10-CM | POA: Diagnosis not present

## 2021-09-13 DIAGNOSIS — H524 Presbyopia: Secondary | ICD-10-CM | POA: Diagnosis not present

## 2021-09-13 DIAGNOSIS — H2511 Age-related nuclear cataract, right eye: Secondary | ICD-10-CM | POA: Diagnosis not present

## 2021-09-13 DIAGNOSIS — H40013 Open angle with borderline findings, low risk, bilateral: Secondary | ICD-10-CM | POA: Diagnosis not present

## 2021-09-20 DIAGNOSIS — Z0000 Encounter for general adult medical examination without abnormal findings: Secondary | ICD-10-CM | POA: Diagnosis not present

## 2021-09-20 DIAGNOSIS — Z79899 Other long term (current) drug therapy: Secondary | ICD-10-CM | POA: Diagnosis not present

## 2021-09-20 DIAGNOSIS — E7800 Pure hypercholesterolemia, unspecified: Secondary | ICD-10-CM | POA: Diagnosis not present

## 2021-09-20 DIAGNOSIS — Z125 Encounter for screening for malignant neoplasm of prostate: Secondary | ICD-10-CM | POA: Diagnosis not present

## 2021-09-20 DIAGNOSIS — R7303 Prediabetes: Secondary | ICD-10-CM | POA: Diagnosis not present

## 2021-09-20 DIAGNOSIS — I10 Essential (primary) hypertension: Secondary | ICD-10-CM | POA: Diagnosis not present

## 2021-09-20 DIAGNOSIS — G4733 Obstructive sleep apnea (adult) (pediatric): Secondary | ICD-10-CM | POA: Diagnosis not present

## 2021-09-28 DIAGNOSIS — H2511 Age-related nuclear cataract, right eye: Secondary | ICD-10-CM | POA: Diagnosis not present

## 2021-10-14 DIAGNOSIS — H2512 Age-related nuclear cataract, left eye: Secondary | ICD-10-CM | POA: Diagnosis not present

## 2021-10-26 DIAGNOSIS — H2512 Age-related nuclear cataract, left eye: Secondary | ICD-10-CM | POA: Diagnosis not present

## 2021-12-13 DIAGNOSIS — Z0100 Encounter for examination of eyes and vision without abnormal findings: Secondary | ICD-10-CM | POA: Diagnosis not present

## 2022-01-05 ENCOUNTER — Encounter: Payer: Self-pay | Admitting: Gastroenterology

## 2022-03-23 DIAGNOSIS — I10 Essential (primary) hypertension: Secondary | ICD-10-CM | POA: Diagnosis not present

## 2022-03-23 DIAGNOSIS — E7800 Pure hypercholesterolemia, unspecified: Secondary | ICD-10-CM | POA: Diagnosis not present

## 2022-03-23 DIAGNOSIS — Z79899 Other long term (current) drug therapy: Secondary | ICD-10-CM | POA: Diagnosis not present

## 2022-05-09 DIAGNOSIS — L718 Other rosacea: Secondary | ICD-10-CM | POA: Diagnosis not present

## 2022-05-09 DIAGNOSIS — L821 Other seborrheic keratosis: Secondary | ICD-10-CM | POA: Diagnosis not present

## 2022-08-06 DIAGNOSIS — R69 Illness, unspecified: Secondary | ICD-10-CM | POA: Diagnosis not present

## 2022-08-24 DIAGNOSIS — G4733 Obstructive sleep apnea (adult) (pediatric): Secondary | ICD-10-CM | POA: Diagnosis not present

## 2022-09-12 DIAGNOSIS — G4733 Obstructive sleep apnea (adult) (pediatric): Secondary | ICD-10-CM | POA: Diagnosis not present

## 2022-10-13 DIAGNOSIS — G4733 Obstructive sleep apnea (adult) (pediatric): Secondary | ICD-10-CM | POA: Diagnosis not present

## 2022-11-12 DIAGNOSIS — G4733 Obstructive sleep apnea (adult) (pediatric): Secondary | ICD-10-CM | POA: Diagnosis not present

## 2022-11-30 DIAGNOSIS — I10 Essential (primary) hypertension: Secondary | ICD-10-CM | POA: Diagnosis not present

## 2022-11-30 DIAGNOSIS — Z9989 Dependence on other enabling machines and devices: Secondary | ICD-10-CM | POA: Diagnosis not present

## 2022-11-30 DIAGNOSIS — Z79899 Other long term (current) drug therapy: Secondary | ICD-10-CM | POA: Diagnosis not present

## 2022-11-30 DIAGNOSIS — Z0000 Encounter for general adult medical examination without abnormal findings: Secondary | ICD-10-CM | POA: Diagnosis not present

## 2022-11-30 DIAGNOSIS — E559 Vitamin D deficiency, unspecified: Secondary | ICD-10-CM | POA: Diagnosis not present

## 2022-11-30 DIAGNOSIS — E7800 Pure hypercholesterolemia, unspecified: Secondary | ICD-10-CM | POA: Diagnosis not present

## 2022-11-30 DIAGNOSIS — R7309 Other abnormal glucose: Secondary | ICD-10-CM | POA: Diagnosis not present

## 2022-11-30 DIAGNOSIS — G4733 Obstructive sleep apnea (adult) (pediatric): Secondary | ICD-10-CM | POA: Diagnosis not present

## 2022-11-30 DIAGNOSIS — Z1331 Encounter for screening for depression: Secondary | ICD-10-CM | POA: Diagnosis not present

## 2022-11-30 DIAGNOSIS — R252 Cramp and spasm: Secondary | ICD-10-CM | POA: Diagnosis not present

## 2022-11-30 DIAGNOSIS — Z125 Encounter for screening for malignant neoplasm of prostate: Secondary | ICD-10-CM | POA: Diagnosis not present

## 2022-11-30 DIAGNOSIS — Z1211 Encounter for screening for malignant neoplasm of colon: Secondary | ICD-10-CM | POA: Diagnosis not present

## 2022-12-12 ENCOUNTER — Other Ambulatory Visit (HOSPITAL_COMMUNITY): Payer: Self-pay | Admitting: Internal Medicine

## 2022-12-12 DIAGNOSIS — E7800 Pure hypercholesterolemia, unspecified: Secondary | ICD-10-CM

## 2022-12-13 DIAGNOSIS — G4733 Obstructive sleep apnea (adult) (pediatric): Secondary | ICD-10-CM | POA: Diagnosis not present

## 2022-12-26 DIAGNOSIS — H40013 Open angle with borderline findings, low risk, bilateral: Secondary | ICD-10-CM | POA: Diagnosis not present

## 2022-12-26 DIAGNOSIS — H524 Presbyopia: Secondary | ICD-10-CM | POA: Diagnosis not present

## 2022-12-26 DIAGNOSIS — H26491 Other secondary cataract, right eye: Secondary | ICD-10-CM | POA: Diagnosis not present

## 2022-12-26 DIAGNOSIS — Z961 Presence of intraocular lens: Secondary | ICD-10-CM | POA: Diagnosis not present

## 2023-01-09 ENCOUNTER — Ambulatory Visit (HOSPITAL_COMMUNITY)
Admission: RE | Admit: 2023-01-09 | Discharge: 2023-01-09 | Disposition: A | Discharge status: Home / Self Care | Payer: PRIVATE HEALTH INSURANCE | Source: Ambulatory Visit | Attending: Internal Medicine | Admitting: Internal Medicine

## 2023-01-09 DIAGNOSIS — E7800 Pure hypercholesterolemia, unspecified: Secondary | ICD-10-CM | POA: Insufficient documentation

## 2023-01-10 DIAGNOSIS — R972 Elevated prostate specific antigen [PSA]: Secondary | ICD-10-CM | POA: Diagnosis not present

## 2023-01-12 DIAGNOSIS — G4733 Obstructive sleep apnea (adult) (pediatric): Secondary | ICD-10-CM | POA: Diagnosis not present

## 2023-02-12 DIAGNOSIS — G4733 Obstructive sleep apnea (adult) (pediatric): Secondary | ICD-10-CM | POA: Diagnosis not present

## 2023-03-15 ENCOUNTER — Encounter: Payer: Self-pay | Admitting: Cardiology

## 2023-03-15 ENCOUNTER — Ambulatory Visit: Payer: Medicare HMO | Admitting: Cardiology

## 2023-03-15 VITALS — BP 155/80 | HR 59 | Resp 16 | Ht 71.0 in | Wt 193.0 lb

## 2023-03-15 DIAGNOSIS — R011 Cardiac murmur, unspecified: Secondary | ICD-10-CM

## 2023-03-15 DIAGNOSIS — I10 Essential (primary) hypertension: Secondary | ICD-10-CM | POA: Diagnosis not present

## 2023-03-15 DIAGNOSIS — E7800 Pure hypercholesterolemia, unspecified: Secondary | ICD-10-CM

## 2023-03-15 DIAGNOSIS — R931 Abnormal findings on diagnostic imaging of heart and coronary circulation: Secondary | ICD-10-CM | POA: Diagnosis not present

## 2023-03-15 DIAGNOSIS — R351 Nocturia: Secondary | ICD-10-CM | POA: Diagnosis not present

## 2023-03-15 DIAGNOSIS — R739 Hyperglycemia, unspecified: Secondary | ICD-10-CM

## 2023-03-15 DIAGNOSIS — R972 Elevated prostate specific antigen [PSA]: Secondary | ICD-10-CM | POA: Diagnosis not present

## 2023-03-15 DIAGNOSIS — N401 Enlarged prostate with lower urinary tract symptoms: Secondary | ICD-10-CM | POA: Diagnosis not present

## 2023-03-15 DIAGNOSIS — G4733 Obstructive sleep apnea (adult) (pediatric): Secondary | ICD-10-CM | POA: Diagnosis not present

## 2023-03-15 MED ORDER — LISINOPRIL 10 MG PO TABS: 5.0000 mg | ORAL_TABLET | Freq: Every day | ORAL | Status: DC

## 2023-03-15 MED ORDER — LISINOPRIL 10 MG PO TABS: 10.0000 mg | ORAL_TABLET | Freq: Every day | ORAL | Status: AC

## 2023-03-15 NOTE — Progress Notes (Unsigned)
Primary Physician/Referring:  Emilio Aspen, MD  Patient ID: Paul Chang, male    DOB: 09/03/1950, 72 y.o.   MRN: [id number]  Chief Complaint  Patient presents with   CAC   New Patient (Initial Visit)    Referred by Dr. Orson Aloe   HPI:    Paul Chang  is a 72 y.o. Caucasian male patient with prior history of tobacco use disorder quit about 5 years ago, hypertension, hypercholesterolemia, OSA on CPAP, hyperglycemia referred to me for cardiac risk stratification, underwent coronary calcium score and presents to establish care.  He is asymptomatic.  Past Medical History:  Diagnosis Date   Hyperlipidemia    OSA (obstructive sleep apnea)    cpap   Past Surgical History:  Procedure Laterality Date   CATARACT EXTRACTION     FINGER TENDON REPAIR     INGUINAL HERNIA REPAIR  07/12/1971   left   KNEE ARTHROSCOPY     Family History  Problem Relation Age of Onset   COPD Mother    Hyperlipidemia Father    Deep vein thrombosis Sister    Colon cancer Neg Hx    Stomach cancer Neg Hx     Social History   Tobacco Use   Smoking status: Former    Types: Cigars    Quit date: 2020    Years since quitting: 4.6   Smokeless tobacco: Never  Substance Use Topics   Alcohol use: Yes    Alcohol/week: 7.0 standard drinks of alcohol    Types: 7 Glasses of wine per week    Comment: occ   Marital Status: Married  ROS  ROS Objective      03/15/2023    9:38 AM 03/19/2020    8:17 AM 12/24/2019    9:33 AM  Vitals with BMI  Height 5\' 11"  6\' 0"  6\' 0"   Weight 193 lbs 210 lbs 210 lbs  BMI 26.93 28.47 28.47  Systolic 155    Diastolic 80    Pulse 59     Blood pressure (!) 155/80, pulse (!) 59, resp. rate 16, height 5\' 11"  (1.803 m), weight 193 lb (87.5 kg), SpO2 97%.  Physical Exam Neck:     Vascular: No carotid bruit or JVD.  Cardiovascular:     Rate and Rhythm: Normal rate and regular rhythm.     Pulses: Intact distal pulses.     Heart sounds: Murmur heard.      Early systolic murmur is present with a grade of 2/6 at the upper left sternal border.     No gallop.  Pulmonary:     Effort: Pulmonary effort is normal.     Breath sounds: Normal breath sounds.  Abdominal:     General: Bowel sounds are normal.     Palpations: Abdomen is soft.  Musculoskeletal:     Right lower leg: No edema.     Left lower leg: No edema.    Laboratory examination:   External labs:   Labs 01/12/2023:  Magnesium 2.3, serum glucose 101 mg, BUN 23, creatinine 1.02, EGFR 80 mL, sodium 140, potassium 4.5.    TSH normal at 2.11, vitamin D 82.9.  A1c 6.0%.  Total cholesterol 203, triglycerides 101, HDL 62, LDL 123.  Non-HDL cholesterol 141.  Hb 13.9/HCT 42.3, platelets 224, normal indicis.  Urinary microalbumin to creatinine ratio 7.6.  Labs 11/30/2022:  Total cholesterol 153, triglycerides 155, HDL 51, LDL 75.  Radiology:   Coronary calcium score 01/09/2023 Total Agatston coronary calcium score  413. MESA database percentile 71. LM: 0 LAD: 287 LCx: 6.6 RCA: 120 Visualized ascending and descending aorta normal dimension, mild aortic atherosclerosis. Extracardiac abnormalities: No significant extracardiac abnormalities.   Cardiac Studies:   NA  EKG:   EKG 03/15/2023: Normal sinus rhythm at the rate of 61 bpm, left anterior fascicular block.  Incomplete right bundle branch block.  Single PVC.    Medications and allergies   Allergies  Allergen Reactions   Aspirin Swelling    Medication list   Current Outpatient Medications:    Multiple Vitamins-Minerals (MULTIVITAMIN MEN) TABS, Orally, Disp: , Rfl:    rosuvastatin (CRESTOR) 10 MG tablet, Take 1 tablet (10 mg total) by mouth daily., Disp: 90 tablet, Rfl: 3   sildenafil (REVATIO) 20 MG tablet, Take 20 mg by mouth as needed., Disp: , Rfl:    lisinopril (ZESTRIL) 10 MG tablet, Take 1 tablet (10 mg total) by mouth daily., Disp: , Rfl:   Assessment     ICD-10-CM   1. Elevated coronary artery calcium score   R93.1 EKG 12-Lead    Lipid Panel With LDL/HDL Ratio    Lipoprotein A (LPA)    2. Hypercholesteremia  E78.00 Lipid Panel With LDL/HDL Ratio    Lipoprotein A (LPA)    3. Hyperglycemia  R73.9 lisinopril (ZESTRIL) 10 MG tablet    DISCONTINUED: lisinopril (ZESTRIL) 10 MG tablet    4. Primary hypertension  I10 lisinopril (ZESTRIL) 10 MG tablet    DISCONTINUED: lisinopril (ZESTRIL) 10 MG tablet    5. Heart murmur  R01.1        Orders Placed This Encounter  Procedures   Lipid Panel With LDL/HDL Ratio   Lipoprotein A (LPA)   EKG 12-Lead    Meds ordered this encounter  Medications   DISCONTD: lisinopril (ZESTRIL) 10 MG tablet    Sig: Take 0.5 tablets (5 mg total) by mouth daily.   lisinopril (ZESTRIL) 10 MG tablet    Sig: Take 1 tablet (10 mg total) by mouth daily.    Medications Discontinued During This Encounter  Medication Reason   lisinopril (ZESTRIL) 5 MG tablet Reorder   lisinopril (ZESTRIL) 10 MG tablet Reorder     Recommendations:   Paul Chang is a 72 y.o. Caucasian male patient with prior history of tobacco use disorder quit about 5 years ago (used to smoke cigars occasionally), hypertension, hypercholesterolemia, OSA on CPAP, hyperglycemia referred to me for cardiac risk stratification, underwent coronary calcium score and presents to establish care.  1. Elevated coronary artery calcium score His coronary calcium score is moderately elevated.  However he is on appropriate medical therapy with statins and also ACE inhibitor, as he is completely asymptomatic and continues to remain fairly active and exercises on a regular basis including cardio exercises, do not think he needs nuclear stress testing or any form of exercise stress testing, continue primary prevention.  I reviewed his labs, LDL had risen from May 2024 through July 2024, I do not know if this was an error as there has been no change in his statin dose.  I will repeat LDL and will also obtain Lp(a) for  further cardiac risk stratification.  - EKG 12-Lead - Lipid Panel With LDL/HDL Ratio - Lipoprotein A (LPA)  2. Hypercholesteremia Patient is presently on appropriately on lipids lowering therapy with Crestor, continue the same and will make dose adjustment depending upon the lab.  - Lipid Panel With LDL/HDL Ratio - Lipoprotein A (LPA)  3. Hyperglycemia Patient has hyperglycemia.  He was tolerating 10 mg of lisinopril well but wanted to come off of the medication and he has made lifestyle changes with regard to his diet and reduce the dose to 5 mg.  Advised him that he should go back to taking 10 mg daily as his blood pressure was elevated today, home recordings prior to this has been under excellent control.   4. Primary hypertension Please see above, I have increased his lisinopril to 10 mg daily. - lisinopril (ZESTRIL) 10 MG tablet; Take 1 tablets (10 mg total) by mouth daily.  Advised him that his goal blood pressure should be 130/80 mmHg or less.  5. Heart murmur He does have a very soft systolic ejection murmur, I suspect he probably has trace aortic regurgitation, however his aortic root size is normal by CT scan and ascending aortic size is normal as well.  As it is a soft murmur, I do not think he needs echocardiogram as well.  Advised him that if lipids are normal, we will continue present dose of the medication otherwise we will consider addition of Zetia or increasing the dose of Crestor to 20 mg.  Unless I see a marked abnormality in lipids and Lp(a), I will see him back on a as needed basis.  He was advised to follow-up again with cardiology in 3 to 4 years to follow-up on both heart murmur and cardiac risk stratification and primary prevention.  Other orders - Multiple Vitamins-Minerals (MULTIVITAMIN MEN) TABS; Orally - sildenafil (REVATIO) 20 MG tablet; Take 20 mg by mouth as needed.   Yates Decamp, MD, [facility] 03/16/2023, 6:39 AM Office: [phone]

## 2023-03-17 LAB — LIPID PANEL WITH LDL/HDL RATIO
Cholesterol, Total: 172 mg/dL (ref 100–199)
HDL: 61 mg/dL (ref >39)
LDL Chol Calc (NIH): 98 mg/dL (ref 0–99)
LDL/HDL Ratio: 1.6 ratio (ref 0.0–3.6)
Triglycerides: 70 mg/dL (ref 0–149)
VLDL Cholesterol Cal: 13 mg/dL (ref 5–40)

## 2023-03-17 LAB — LIPOPROTEIN A (LPA): Lipoprotein (a): <8.4 nmol/L (ref <75.0)

## 2023-04-14 DIAGNOSIS — G4733 Obstructive sleep apnea (adult) (pediatric): Secondary | ICD-10-CM | POA: Diagnosis not present

## 2023-04-18 ENCOUNTER — Telehealth: Payer: Self-pay | Admitting: *Deleted

## 2023-04-18 NOTE — Telephone Encounter (Signed)
   Patient Name: Paul Chang  DOB: 02-17-1951 MRN: [id number]  Primary Cardiologist: None  Chart reviewed as part of pre-operative protocol coverage. Given past medical history and time since last visit, based on ACC/AHA guidelines, Paul Chang is at acceptable risk for the planned procedure without further cardiovascular testing.   The patient was advised that if he develops new symptoms prior to surgery to contact our office to arrange for a follow-up visit, and he verbalized understanding.  I will route this recommendation to the requesting party via Epic fax function and remove from pre-op pool.  Please call with questions.  Joni Reining, NP 04/18/2023, 12:44 PM

## 2023-04-18 NOTE — Telephone Encounter (Signed)
   Pre-operative Risk Assessment    Patient Name: Paul Chang  DOB: 1950-11-24 MRN: [id number]      Request for Surgical Clearance    Procedure:   Colonoscopy  Date of Surgery:  Clearance 05/18/23                                 Surgeon:  Dr. Liliane Shi Surgeon's Group or Practice Name:  [facility] Phone number:  [phone] Fax number:  [phone]   Type of Clearance Requested:   - Medical    Type of Anesthesia:   Propofol   Additional requests/questions:    Signed, Emmit Pomfret   04/18/2023, 7:55 AM

## 2023-04-27 ENCOUNTER — Other Ambulatory Visit: Payer: Self-pay | Admitting: Urology

## 2023-04-27 DIAGNOSIS — R972 Elevated prostate specific antigen [PSA]: Secondary | ICD-10-CM

## 2023-05-15 DIAGNOSIS — G4733 Obstructive sleep apnea (adult) (pediatric): Secondary | ICD-10-CM | POA: Diagnosis not present

## 2023-05-18 DIAGNOSIS — K5730 Diverticulosis of large intestine without perforation or abscess without bleeding: Secondary | ICD-10-CM | POA: Diagnosis not present

## 2023-05-18 DIAGNOSIS — D120 Benign neoplasm of cecum: Secondary | ICD-10-CM | POA: Diagnosis not present

## 2023-05-18 DIAGNOSIS — D123 Benign neoplasm of transverse colon: Secondary | ICD-10-CM | POA: Diagnosis not present

## 2023-05-18 DIAGNOSIS — K640 First degree hemorrhoids: Secondary | ICD-10-CM | POA: Diagnosis not present

## 2023-05-18 DIAGNOSIS — Z1211 Encounter for screening for malignant neoplasm of colon: Secondary | ICD-10-CM | POA: Diagnosis not present

## 2023-05-23 DIAGNOSIS — D123 Benign neoplasm of transverse colon: Secondary | ICD-10-CM | POA: Diagnosis not present

## 2023-05-23 DIAGNOSIS — D120 Benign neoplasm of cecum: Secondary | ICD-10-CM | POA: Diagnosis not present

## 2023-05-29 ENCOUNTER — Encounter: Payer: Self-pay | Admitting: Urology

## 2023-06-01 ENCOUNTER — Encounter: Payer: Self-pay | Admitting: Urology

## 2023-06-02 ENCOUNTER — Encounter: Payer: Self-pay | Admitting: Urology

## 2023-06-05 ENCOUNTER — Encounter: Payer: Self-pay | Admitting: Urology

## 2023-06-07 ENCOUNTER — Encounter: Payer: Self-pay | Admitting: Urology

## 2023-06-10 ENCOUNTER — Ambulatory Visit
Admission: RE | Admit: 2023-06-10 | Discharge: 2023-06-10 | Disposition: A | Discharge status: Home / Self Care | Payer: Medicare HMO | Source: Ambulatory Visit | Attending: Urology | Admitting: Urology

## 2023-06-10 DIAGNOSIS — R972 Elevated prostate specific antigen [PSA]: Secondary | ICD-10-CM | POA: Diagnosis not present

## 2023-06-10 MED ORDER — GADOPICLENOL 0.5 MMOL/ML IV SOLN
8.0000 mL | Freq: Once | INTRAVENOUS | Status: AC | PRN
  Administered 2023-06-10: 8 mL via INTRAVENOUS

## 2023-06-14 DIAGNOSIS — G4733 Obstructive sleep apnea (adult) (pediatric): Secondary | ICD-10-CM | POA: Diagnosis not present

## 2023-06-29 DIAGNOSIS — H40013 Open angle with borderline findings, low risk, bilateral: Secondary | ICD-10-CM | POA: Diagnosis not present

## 2023-09-01 DIAGNOSIS — R972 Elevated prostate specific antigen [PSA]: Secondary | ICD-10-CM | POA: Diagnosis not present

## 2023-09-01 DIAGNOSIS — D075 Carcinoma in situ of prostate: Secondary | ICD-10-CM | POA: Diagnosis not present

## 2023-09-01 DIAGNOSIS — C61 Malignant neoplasm of prostate: Secondary | ICD-10-CM | POA: Diagnosis not present

## 2023-09-11 ENCOUNTER — Other Ambulatory Visit (HOSPITAL_COMMUNITY): Payer: Self-pay | Admitting: Urology

## 2023-09-11 DIAGNOSIS — C61 Malignant neoplasm of prostate: Secondary | ICD-10-CM

## 2023-09-18 ENCOUNTER — Encounter (HOSPITAL_COMMUNITY)
Admission: RE | Admit: 2023-09-18 | Discharge: 2023-09-18 | Disposition: A | Discharge status: Home / Self Care | Payer: PRIVATE HEALTH INSURANCE | Source: Ambulatory Visit | Attending: Urology | Admitting: Urology

## 2023-09-18 DIAGNOSIS — C61 Malignant neoplasm of prostate: Secondary | ICD-10-CM | POA: Diagnosis not present

## 2023-09-18 MED ORDER — FLOTUFOLASTAT F 18 GALLIUM 296-5846 MBQ/ML IV SOLN
7.7540 | Freq: Once | INTRAVENOUS | Status: AC
  Administered 2023-09-18: 7.754 via INTRAVENOUS

## 2023-10-02 DIAGNOSIS — C61 Malignant neoplasm of prostate: Secondary | ICD-10-CM | POA: Diagnosis not present

## 2023-10-02 DIAGNOSIS — N4231 Prostatic intraepithelial neoplasia: Secondary | ICD-10-CM | POA: Diagnosis not present

## 2023-10-13 DIAGNOSIS — C61 Malignant neoplasm of prostate: Secondary | ICD-10-CM | POA: Diagnosis not present

## 2023-10-13 DIAGNOSIS — N529 Male erectile dysfunction, unspecified: Secondary | ICD-10-CM | POA: Diagnosis not present

## 2023-10-18 DIAGNOSIS — R351 Nocturia: Secondary | ICD-10-CM | POA: Diagnosis not present

## 2023-10-18 DIAGNOSIS — N401 Enlarged prostate with lower urinary tract symptoms: Secondary | ICD-10-CM | POA: Diagnosis not present

## 2023-10-18 DIAGNOSIS — N5201 Erectile dysfunction due to arterial insufficiency: Secondary | ICD-10-CM | POA: Diagnosis not present

## 2023-10-18 DIAGNOSIS — C61 Malignant neoplasm of prostate: Secondary | ICD-10-CM | POA: Diagnosis not present

## 2023-11-01 DIAGNOSIS — C61 Malignant neoplasm of prostate: Secondary | ICD-10-CM | POA: Diagnosis not present

## 2023-12-12 DIAGNOSIS — C61 Malignant neoplasm of prostate: Secondary | ICD-10-CM | POA: Diagnosis not present

## 2023-12-18 DIAGNOSIS — K080 Exfoliation of teeth due to systemic causes: Secondary | ICD-10-CM | POA: Diagnosis not present

## 2023-12-20 DIAGNOSIS — C61 Malignant neoplasm of prostate: Secondary | ICD-10-CM | POA: Diagnosis not present

## 2023-12-21 DIAGNOSIS — E559 Vitamin D deficiency, unspecified: Secondary | ICD-10-CM | POA: Diagnosis not present

## 2023-12-21 DIAGNOSIS — E7800 Pure hypercholesterolemia, unspecified: Secondary | ICD-10-CM | POA: Diagnosis not present

## 2023-12-21 DIAGNOSIS — Z79899 Other long term (current) drug therapy: Secondary | ICD-10-CM | POA: Diagnosis not present

## 2023-12-21 DIAGNOSIS — R7303 Prediabetes: Secondary | ICD-10-CM | POA: Diagnosis not present

## 2023-12-21 DIAGNOSIS — Z1331 Encounter for screening for depression: Secondary | ICD-10-CM | POA: Diagnosis not present

## 2023-12-21 DIAGNOSIS — I10 Essential (primary) hypertension: Secondary | ICD-10-CM | POA: Diagnosis not present

## 2023-12-21 DIAGNOSIS — Z0000 Encounter for general adult medical examination without abnormal findings: Secondary | ICD-10-CM | POA: Diagnosis not present

## 2023-12-21 DIAGNOSIS — G4733 Obstructive sleep apnea (adult) (pediatric): Secondary | ICD-10-CM | POA: Diagnosis not present

## 2023-12-25 DIAGNOSIS — C61 Malignant neoplasm of prostate: Secondary | ICD-10-CM | POA: Diagnosis not present

## 2023-12-27 DIAGNOSIS — C61 Malignant neoplasm of prostate: Secondary | ICD-10-CM | POA: Diagnosis not present

## 2023-12-29 DIAGNOSIS — C61 Malignant neoplasm of prostate: Secondary | ICD-10-CM | POA: Diagnosis not present

## 2024-01-01 DIAGNOSIS — C61 Malignant neoplasm of prostate: Secondary | ICD-10-CM | POA: Diagnosis not present

## 2024-01-03 DIAGNOSIS — C61 Malignant neoplasm of prostate: Secondary | ICD-10-CM | POA: Diagnosis not present

## 2024-03-04 DIAGNOSIS — Z961 Presence of intraocular lens: Secondary | ICD-10-CM | POA: Diagnosis not present

## 2024-03-04 DIAGNOSIS — H26491 Other secondary cataract, right eye: Secondary | ICD-10-CM | POA: Diagnosis not present

## 2024-03-04 DIAGNOSIS — H40013 Open angle with borderline findings, low risk, bilateral: Secondary | ICD-10-CM | POA: Diagnosis not present

## 2024-03-05 DIAGNOSIS — I10 Essential (primary) hypertension: Secondary | ICD-10-CM | POA: Diagnosis not present

## 2024-03-05 DIAGNOSIS — K4090 Unilateral inguinal hernia, without obstruction or gangrene, not specified as recurrent: Secondary | ICD-10-CM | POA: Diagnosis not present

## 2024-03-14 ENCOUNTER — Ambulatory Visit: Payer: Self-pay | Admitting: General Surgery

## 2024-03-14 DIAGNOSIS — K4090 Unilateral inguinal hernia, without obstruction or gangrene, not specified as recurrent: Secondary | ICD-10-CM | POA: Diagnosis not present

## 2024-03-14 NOTE — H&P (Signed)
 Chief Complaint: New Consultation (rt ing hernia)       History of Present Illness: Paul Chang is a 73 y.o. male who is seen today as an office consultation at the request of Dr. Charlott for evaluation of New Consultation (rt ing hernia) .   History of Present Illness Paul Chang is a 73 year old male who presents with a right inguinal hernia. He was referred by Dr. Charlott for evaluation of a right inguinal hernia.   He developed the right inguinal hernia in the spring, which has progressively worsened, causing significant pain and discomfort. There is a noticeable bulge on the right side. He has undergone left inguinal hernia repair but has not had surgery on the right side.   He completed radiation therapy for prostate cancer at the end of June 2025. Current medications include blood pressure medication, cholesterol control medication, and tamsulosin (Flomax). He uses a CPAP machine for sleep apnea, which he has been using for 15 years.   He denies any history of heart, lung, or kidney problems.       Review of Systems: A complete review of systems was obtained from the patient.  I have reviewed this information and discussed as appropriate with the patient.  See HPI as well for other ROS.   Review of Systems  Constitutional:  Negative for fever.  HENT:  Negative for congestion.   Eyes:  Negative for blurred vision.  Respiratory:  Negative for cough, shortness of breath and wheezing.   Cardiovascular:  Negative for chest pain and palpitations.  Gastrointestinal:  Negative for heartburn.  Genitourinary:  Negative for dysuria.  Musculoskeletal:  Negative for myalgias.  Skin:  Negative for rash.  Neurological:  Negative for dizziness and headaches.  Psychiatric/Behavioral:  Negative for depression and suicidal ideas.   All other systems reviewed and are negative.       Medical History: Past Medical History Past Medical History: Diagnosis Date  Arthritis     Diabetes mellitus without complication (CMS/HHS-HCC)    Hypercholesterolemia    Hypertension    Sleep apnea        Problem List Patient Active Problem List Diagnosis  Prostate cancer (CMS/HHS-HCC)      Past Surgical History Past Surgical History: Procedure Laterality Date  INGUINAL HERNIA REPAIR   07/12/1971  CATARACT EXTRACTION      KNEE ARTHROSCOPY      REPAIR EXTENSOR TENDON FINGER          Allergies Allergies Allergen Reactions  Aspirin Anaphylaxis      Medications Ordered Prior to Encounter Current Outpatient Medications on File Prior to Visit Medication Sig Dispense Refill  lisinopriL  (ZESTRIL ) 10 MG tablet Take 10 mg by mouth once daily      rosuvastatin  (CRESTOR ) 10 MG tablet Take 10 mg by mouth once daily      sildenafiL (VIAGRA) 50 MG tablet Take 50 mg by mouth once daily as needed for Erectile Dysfunction      tamsulosin (FLOMAX) 0.4 mg capsule Take 1 capsule (0.4 mg total) by mouth 2 (two) times daily Take 30 minutes after same meal each day. 60 capsule 11    No current facility-administered medications on file prior to visit.      Family History Family History Problem Relation Age of Onset  COPD Mother    Hyperlipidemia (Elevated cholesterol) Father    Deep vein thrombosis (DVT or abnormal blood clot formation) Sister    Stomach cancer Neg Hx    Colon cancer Neg  Hx        Tobacco Use History Social History    Tobacco Use Smoking Status Former  Types: Cigars  Quit date: 2020  Years since quitting: 5.6 Smokeless Tobacco Never      Social History Social History    Socioeconomic History  Marital status: Married Tobacco Use  Smoking status: Former     Types: Cigars     Quit date: 2020     Years since quitting: 5.6  Smokeless tobacco: Never Vaping Use  Vaping status: Never Used Substance and Sexual Activity  Alcohol use: Yes     Alcohol/week: 0.0 - 1.0 standard drinks of alcohol  Drug use: Never  Sexual activity: Yes      Partners: Female    Social Drivers of Health    Housing Stability: Unknown (10/08/2023)   Housing Stability Vital Sign    Homeless in the Last Year: No      Objective:     Vitals:   03/14/24 1507 BP: (!) 144/73 Pulse: 71 Temp: 36.9 C (98.4 F) TempSrc: Temporal SpO2: 97% Weight: 87.6 kg (193 lb 3.2 oz) Height: 180.3 cm (5' 11) PainSc: 0-No pain   Body mass index is 26.95 kg/m. Physical Exam Constitutional:      Appearance: Normal appearance.  HENT:     Head: Normocephalic and atraumatic.     Nose: Nose normal. No congestion.     Mouth/Throat:     Mouth: Mucous membranes are moist.     Pharynx: Oropharynx is clear.  Eyes:     Pupils: Pupils are equal, round, and reactive to light.  Cardiovascular:     Rate and Rhythm: Normal rate and regular rhythm.     Pulses: Normal pulses.     Heart sounds: Normal heart sounds. No murmur heard.    No friction rub. No gallop.  Pulmonary:     Effort: Pulmonary effort is normal. No respiratory distress.     Breath sounds: Normal breath sounds. No stridor. No wheezing, rhonchi or rales.  Abdominal:     General: Abdomen is flat.     Hernia: A hernia is present. Hernia is present in the left inguinal area and right inguinal area.  Musculoskeletal:        General: Normal range of motion.     Cervical back: Normal range of motion.  Skin:    General: Skin is warm and dry.  Neurological:     General: No focal deficit present.     Mental Status: He is alert and oriented to person, place, and time.  Psychiatric:        Mood and Affect: Mood normal.        Thought Content: Thought content normal.         Assessment and Plan: Diagnoses and all orders for this visit:   Unilateral inguinal hernia without obstruction or gangrene, recurrence not specified     Paul Chang is a 73 y.o. male    1.  We will proceed to the OR for a LAP RIGHT inguinal hernia repair with mesh. 2. All risks and benefits were discussed with the  patient, to generally include infection, bleeding, damage to surrounding structures, acute and chronic nerve pain, and recurrence. Alternatives were offered and described.  All questions were answered and the patient voiced understanding of the procedure and wishes to proceed at this point.             No follow-ups on file.   Lynda Leos, MD, FACS  [facility], [facility]

## 2024-04-08 DIAGNOSIS — C61 Malignant neoplasm of prostate: Secondary | ICD-10-CM | POA: Diagnosis not present

## 2024-04-08 DIAGNOSIS — L821 Other seborrheic keratosis: Secondary | ICD-10-CM | POA: Diagnosis not present

## 2024-04-08 DIAGNOSIS — D2261 Melanocytic nevi of right upper limb, including shoulder: Secondary | ICD-10-CM | POA: Diagnosis not present

## 2024-04-08 DIAGNOSIS — D2262 Melanocytic nevi of left upper limb, including shoulder: Secondary | ICD-10-CM | POA: Diagnosis not present

## 2024-04-08 DIAGNOSIS — D225 Melanocytic nevi of trunk: Secondary | ICD-10-CM | POA: Diagnosis not present

## 2024-04-24 ENCOUNTER — Other Ambulatory Visit: Payer: Self-pay

## 2024-04-24 ENCOUNTER — Ambulatory Visit: Payer: Self-pay | Admitting: General Surgery

## 2024-04-24 ENCOUNTER — Encounter (HOSPITAL_COMMUNITY): Payer: Self-pay | Admitting: General Surgery

## 2024-04-24 NOTE — Progress Notes (Signed)
 SDW call  Patient was given pre-op instructions over the phone. Patient verbalized understanding of instructions provided.     PCP - Dr. Dorn Sauers Cardiologist -  Pulmonary:    PPM/ICD - denies Device Orders - na Rep Notified - na   Chest x-ray - na EKG -  DOS, 04/25/2024 Stress Test - ECHO -  Cardiac Cath -   Sleep Study/sleep apnea/CPAP: OSA with CPAP  Non-diabetic  Blood Thinner Instructions: denies Aspirin Instructions:denies   ERAS Protcol - Clears until 1110   Anesthesia review: No   Patient denies shortness of breath, fever, cough and chest pain over the phone call  Your procedure is scheduled on Thursday April 25, 2024  Report to [facility] Main Entrance A at   1140 A.M., then check in with the Admitting office.  Call this number if you have problems the morning of surgery:  [phone]   If you have any questions prior to your surgery date call [phone]: Open Monday-Friday 8am-4pm If you experience any cold or flu symptoms such as cough, fever, chills, shortness of breath, etc. between now and your scheduled surgery, please notify us  at the above number     Remember:  Do not eat after midnight the night before your surgery  You may drink clear liquids until   1110  the morning of your surgery.   Clear liquids allowed are: Water, Non-Citrus Juices (without pulp), Carbonated Beverages, Clear Tea, Black Coffee ONLY (NO MILK, CREAM OR POWDERED CREAMER of any kind), and Gatorade   Take these medicines the morning of surgery with A SIP OF WATER:  Crestor   As needed: Flonase  As of today, STOP taking any Aspirin (unless otherwise instructed by your surgeon) Aleve, Naproxen, Ibuprofen, Motrin, Advil, Goody's, BC's, all herbal medications, fish oil, and all vitamins.

## 2024-04-25 ENCOUNTER — Ambulatory Visit (HOSPITAL_COMMUNITY)

## 2024-04-25 ENCOUNTER — Encounter (HOSPITAL_COMMUNITY)
Admission: RE | Disposition: A | Discharge status: Home / Self Care | Payer: Self-pay | Source: Home / Self Care | Attending: General Surgery

## 2024-04-25 ENCOUNTER — Ambulatory Visit (HOSPITAL_COMMUNITY)
Admission: RE | Admit: 2024-04-25 | Discharge: 2024-04-25 | Disposition: A | Discharge status: Home / Self Care | Attending: General Surgery | Admitting: General Surgery

## 2024-04-25 ENCOUNTER — Encounter (HOSPITAL_COMMUNITY): Payer: Self-pay | Admitting: General Surgery

## 2024-04-25 DIAGNOSIS — Z79899 Other long term (current) drug therapy: Secondary | ICD-10-CM | POA: Insufficient documentation

## 2024-04-25 DIAGNOSIS — K4090 Unilateral inguinal hernia, without obstruction or gangrene, not specified as recurrent: Secondary | ICD-10-CM | POA: Diagnosis not present

## 2024-04-25 DIAGNOSIS — I10 Essential (primary) hypertension: Secondary | ICD-10-CM | POA: Diagnosis not present

## 2024-04-25 DIAGNOSIS — G4730 Sleep apnea, unspecified: Secondary | ICD-10-CM | POA: Insufficient documentation

## 2024-04-25 DIAGNOSIS — Z87891 Personal history of nicotine dependence: Secondary | ICD-10-CM | POA: Diagnosis not present

## 2024-04-25 DIAGNOSIS — Z8546 Personal history of malignant neoplasm of prostate: Secondary | ICD-10-CM | POA: Insufficient documentation

## 2024-04-25 DIAGNOSIS — Z923 Personal history of irradiation: Secondary | ICD-10-CM | POA: Insufficient documentation

## 2024-04-25 HISTORY — PX: INGUINAL HERNIA REPAIR: SHX194

## 2024-04-25 HISTORY — DX: Essential (primary) hypertension: I10

## 2024-04-25 LAB — CBC
HCT: 43 % (ref 39.0–52.0)
Hemoglobin: 14.1 g/dL (ref 13.0–17.0)
MCH: 30.7 pg (ref 26.0–34.0)
MCHC: 32.8 g/dL (ref 30.0–36.0)
MCV: 93.7 fL (ref 80.0–100.0)
Platelets: 195 K/uL (ref 150–400)
RBC: 4.59 MIL/uL (ref 4.22–5.81)
RDW: 12.4 % (ref 11.5–15.5)
WBC: 7.9 K/uL (ref 4.0–10.5)
nRBC: 0 % (ref 0.0–0.2)

## 2024-04-25 LAB — BASIC METABOLIC PANEL WITH GFR
Anion gap: 10 (ref 5–15)
BUN: 18 mg/dL (ref 8–23)
CO2: 24 mmol/L (ref 22–32)
Calcium: 9.1 mg/dL (ref 8.9–10.3)
Chloride: 105 mmol/L (ref 98–111)
Creatinine, Ser: 1.01 mg/dL (ref 0.61–1.24)
GFR, Estimated: >60 mL/min (ref >60)
Glucose, Bld: 113 mg/dL — ABNORMAL HIGH (ref 70–99)
Potassium: 3.9 mmol/L (ref 3.5–5.1)
Sodium: 139 mmol/L (ref 135–145)

## 2024-04-25 SURGERY — REPAIR, HERNIA, INGUINAL, LAPAROSCOPIC
Anesthesia: General | Site: Pelvis | Laterality: Right

## 2024-04-25 MED ORDER — PROPOFOL 10 MG/ML IV BOLUS
INTRAVENOUS | Status: DC | PRN
  Administered 2024-04-25: 100 mg via INTRAVENOUS

## 2024-04-25 MED ORDER — BUPIVACAINE HCL 0.25 % IJ SOLN
INTRAMUSCULAR | Status: DC | PRN
  Administered 2024-04-25: 6 mL

## 2024-04-25 MED ORDER — SUGAMMADEX SODIUM 200 MG/2ML IV SOLN
INTRAVENOUS | Status: DC | PRN
  Administered 2024-04-25: 350 mg via INTRAVENOUS

## 2024-04-25 MED ORDER — CEFAZOLIN SODIUM-DEXTROSE 2-4 GM/100ML-% IV SOLN
2.0000 g | INTRAVENOUS | Status: AC
  Administered 2024-04-25: 2 g via INTRAVENOUS

## 2024-04-25 MED ORDER — FENTANYL CITRATE (PF) 100 MCG/2ML IJ SOLN: 25.0000 ug | INTRAMUSCULAR | Status: DC | PRN

## 2024-04-25 MED ORDER — ENSURE PRE-SURGERY PO LIQD: 296.0000 mL | Freq: Once | ORAL | Status: DC

## 2024-04-25 MED ORDER — FENTANYL CITRATE (PF) 100 MCG/2ML IJ SOLN
INTRAMUSCULAR | Status: AC
  Filled 2024-04-25: qty 2

## 2024-04-25 MED ORDER — ORAL CARE MOUTH RINSE: 15.0000 mL | Freq: Once | OROMUCOSAL | Status: DC

## 2024-04-25 MED ORDER — DEXAMETHASONE SOD PHOSPHATE PF 10 MG/ML IJ SOLN
INTRAMUSCULAR | Status: DC | PRN
  Administered 2024-04-25: 5 mg via INTRAVENOUS

## 2024-04-25 MED ORDER — CHLORHEXIDINE GLUCONATE CLOTH 2 % EX PADS
6.0000 | MEDICATED_PAD | Freq: Once | CUTANEOUS | Status: AC
  Administered 2024-04-25: 6 via TOPICAL

## 2024-04-25 MED ORDER — LIDOCAINE 2% (20 MG/ML) 5 ML SYRINGE
INTRAMUSCULAR | Status: AC
  Filled 2024-04-25: qty 5

## 2024-04-25 MED ORDER — ONDANSETRON HCL 4 MG/2ML IJ SOLN
INTRAMUSCULAR | Status: DC | PRN
  Administered 2024-04-25: 4 mg via INTRAVENOUS

## 2024-04-25 MED ORDER — ONDANSETRON HCL 4 MG/2ML IJ SOLN
INTRAMUSCULAR | Status: AC
  Filled 2024-04-25: qty 2

## 2024-04-25 MED ORDER — 0.9 % SODIUM CHLORIDE (POUR BTL) OPTIME
TOPICAL | Status: DC | PRN
  Administered 2024-04-25: 50 mL

## 2024-04-25 MED ORDER — MIDAZOLAM HCL 2 MG/2ML IJ SOLN
INTRAMUSCULAR | Status: AC
  Filled 2024-04-25: qty 2

## 2024-04-25 MED ORDER — CHLORHEXIDINE GLUCONATE 0.12 % MT SOLN: 15.0000 mL | Freq: Once | OROMUCOSAL | Status: DC

## 2024-04-25 MED ORDER — CHLORHEXIDINE GLUCONATE CLOTH 2 % EX PADS: 6.0000 | MEDICATED_PAD | Freq: Once | CUTANEOUS | Status: DC

## 2024-04-25 MED ORDER — ACETAMINOPHEN 500 MG PO TABS
1000.0000 mg | ORAL_TABLET | ORAL | Status: AC
  Administered 2024-04-25: 1000 mg via ORAL
  Filled 2024-04-25: qty 2

## 2024-04-25 MED ORDER — BUPIVACAINE HCL (PF) 0.25 % IJ SOLN
INTRAMUSCULAR | Status: AC
  Filled 2024-04-25: qty 30

## 2024-04-25 MED ORDER — CHLORHEXIDINE GLUCONATE 0.12 % MT SOLN
OROMUCOSAL | Status: AC
  Administered 2024-04-25: 15 mL
  Filled 2024-04-25: qty 15

## 2024-04-25 MED ORDER — OXYCODONE HCL 5 MG PO TABS: 5.0000 mg | ORAL_TABLET | Freq: Once | ORAL | Status: DC | PRN

## 2024-04-25 MED ORDER — MIDAZOLAM HCL (PF) 2 MG/2ML IJ SOLN
INTRAMUSCULAR | Status: DC | PRN
  Administered 2024-04-25: 2 mg via INTRAVENOUS

## 2024-04-25 MED ORDER — TRAMADOL HCL 50 MG PO TABS: 50.0000 mg | ORAL_TABLET | Freq: Four times a day (QID) | ORAL | 0 refills | Status: AC | PRN

## 2024-04-25 MED ORDER — ROCURONIUM BROMIDE 10 MG/ML (PF) SYRINGE
PREFILLED_SYRINGE | INTRAVENOUS | Status: DC | PRN
  Administered 2024-04-25: 60 mg via INTRAVENOUS

## 2024-04-25 MED ORDER — LACTATED RINGERS IV SOLN: INTRAVENOUS | Status: DC

## 2024-04-25 MED ORDER — ROCURONIUM BROMIDE 10 MG/ML (PF) SYRINGE
PREFILLED_SYRINGE | INTRAVENOUS | Status: AC
  Filled 2024-04-25: qty 10

## 2024-04-25 MED ORDER — OXYCODONE HCL 5 MG/5ML PO SOLN: 5.0000 mg | Freq: Once | ORAL | Status: DC | PRN

## 2024-04-25 MED ORDER — LIDOCAINE 2% (20 MG/ML) 5 ML SYRINGE
INTRAMUSCULAR | Status: DC | PRN
  Administered 2024-04-25: 100 mg via INTRAVENOUS

## 2024-04-25 MED ORDER — CEFAZOLIN SODIUM-DEXTROSE 2-4 GM/100ML-% IV SOLN
INTRAVENOUS | Status: AC
  Filled 2024-04-25: qty 100

## 2024-04-25 MED ORDER — AMISULPRIDE (ANTIEMETIC) 5 MG/2ML IV SOLN: 10.0000 mg | Freq: Once | INTRAVENOUS | Status: DC | PRN

## 2024-04-25 MED ORDER — PROPOFOL 10 MG/ML IV BOLUS
INTRAVENOUS | Status: AC
  Filled 2024-04-25: qty 20

## 2024-04-25 MED ORDER — FENTANYL CITRATE (PF) 250 MCG/5ML IJ SOLN
INTRAMUSCULAR | Status: DC | PRN
  Administered 2024-04-25: 100 ug via INTRAVENOUS

## 2024-04-25 SURGICAL SUPPLY — 34 items
BAG COUNTER SPONGE SURGICOUNT (BAG) ×1 IMPLANT
CANISTER SUCTION 3000ML PPV (SUCTIONS) IMPLANT
COVER SURGICAL LIGHT HANDLE (MISCELLANEOUS) ×1 IMPLANT
DERMABOND ADVANCED .7 DNX12 (GAUZE/BANDAGES/DRESSINGS) ×1 IMPLANT
DISSECTOR BLUNT TIP ENDO 5MM (MISCELLANEOUS) IMPLANT
ELECTRODE REM PT RTRN 9FT ADLT (ELECTROSURGICAL) ×1 IMPLANT
ENDOLOOP SUT PDS II 0 18 (SUTURE) IMPLANT
GLOVE BIO SURGEON STRL SZ7.5 (GLOVE) ×2 IMPLANT
GOWN STRL REUS W/ TWL LRG LVL3 (GOWN DISPOSABLE) ×2 IMPLANT
GOWN STRL REUS W/ TWL XL LVL3 (GOWN DISPOSABLE) ×1 IMPLANT
IRRIGATION SUCT STRKRFLW 2 WTP (MISCELLANEOUS) IMPLANT
KIT BASIN OR (CUSTOM PROCEDURE TRAY) ×1 IMPLANT
KIT TURNOVER KIT B (KITS) ×1 IMPLANT
MESH 3DMAX 5X7 RT XLRG (Mesh General) IMPLANT
NDL INSUFFLATION 14GA 120MM (NEEDLE) IMPLANT
NEEDLE INSUFFLATION 14GA 120MM (NEEDLE) IMPLANT
PAD ARMBOARD POSITIONER FOAM (MISCELLANEOUS) ×2 IMPLANT
RELOAD STAPLE 4.0 BLU F/HERNIA (INSTRUMENTS) IMPLANT
SCISSORS LAP 5X35 DISP (ENDOMECHANICALS) ×1 IMPLANT
SET TUBE SMOKE EVAC HIGH FLOW (TUBING) ×1 IMPLANT
SOLN 0.9% NACL 1000 ML (IV SOLUTION) ×1 IMPLANT
SOLN 0.9% NACL POUR BTL 1000ML (IV SOLUTION) ×1 IMPLANT
SOLN STERILE WATER 1000 ML (IV SOLUTION) ×1 IMPLANT
SOLN STERILE WATER BTL 1000 ML (IV SOLUTION) ×1 IMPLANT
STAPLER HERNIA 12 8.5 360D (INSTRUMENTS) IMPLANT
SUT MNCRL AB 4-0 PS2 18 (SUTURE) ×1 IMPLANT
SUT VIC AB 1 CT1 27XBRD ANBCTR (SUTURE) IMPLANT
TOWEL GREEN STERILE (TOWEL DISPOSABLE) ×1 IMPLANT
TOWEL GREEN STERILE FF (TOWEL DISPOSABLE) ×1 IMPLANT
TRAY LAPAROSCOPIC MC (CUSTOM PROCEDURE TRAY) ×1 IMPLANT
TROCAR OPTICAL SHORT 5MM (TROCAR) ×1 IMPLANT
TROCAR OPTICAL SLV SHORT 5MM (TROCAR) ×1 IMPLANT
TROCAR Z THREAD OPTICAL 12X100 (TROCAR) ×1 IMPLANT
WARMER LAPAROSCOPE (MISCELLANEOUS) ×1 IMPLANT

## 2024-04-25 NOTE — Anesthesia Procedure Notes (Signed)
 Procedure Name: Intubation Date/Time: 04/25/2024 1:46 PM  Performed by: Elby Raelene SAUNDERS, CRNAPre-anesthesia Checklist: Patient identified, Emergency Drugs available, Suction available and Patient being monitored Patient Re-evaluated:Patient Re-evaluated prior to induction Oxygen Delivery Method: Circle System Utilized Preoxygenation: Pre-oxygenation with 100% oxygen Induction Type: IV induction Ventilation: Mask ventilation without difficulty Laryngoscope Size: Miller and 2 Grade View: Grade I Tube type: Oral Tube size: 7.5 mm Number of attempts: 1 Airway Equipment and Method: Stylet and Bite block Placement Confirmation: ETT inserted through vocal cords under direct vision, positive ETCO2 and breath sounds checked- equal and bilateral Secured at: 23 cm Tube secured with: Tape Dental Injury: Teeth and Oropharynx as per pre-operative assessment

## 2024-04-25 NOTE — H&P (Signed)
 Chief Complaint: New Consultation (rt ing hernia)       History of Present Illness: Paul Chang is a 73 y.o. male who is seen today as an office consultation at the request of Dr. Charlott for evaluation of New Consultation (rt ing hernia) .   History of Present Illness Paul Chang is a 73 year old male who presents with a right inguinal hernia. He was referred by Dr. Charlott for evaluation of a right inguinal hernia.   He developed the right inguinal hernia in the spring, which has progressively worsened, causing significant pain and discomfort. There is a noticeable bulge on the right side. He has undergone left inguinal hernia repair but has not had surgery on the right side.   He completed radiation therapy for prostate cancer at the end of June 2025. Current medications include blood pressure medication, cholesterol control medication, and tamsulosin (Flomax). He uses a CPAP machine for sleep apnea, which he has been using for 15 years.   He denies any history of heart, lung, or kidney problems.       Review of Systems: A complete review of systems was obtained from the patient.  I have reviewed this information and discussed as appropriate with the patient.  See HPI as well for other ROS.   Review of Systems  Constitutional:  Negative for fever.  HENT:  Negative for congestion.   Eyes:  Negative for blurred vision.  Respiratory:  Negative for cough, shortness of breath and wheezing.   Cardiovascular:  Negative for chest pain and palpitations.  Gastrointestinal:  Negative for heartburn.  Genitourinary:  Negative for dysuria.  Musculoskeletal:  Negative for myalgias.  Skin:  Negative for rash.  Neurological:  Negative for dizziness and headaches.  Psychiatric/Behavioral:  Negative for depression and suicidal ideas.   All other systems reviewed and are negative.       Medical History: Past Medical History Past Medical History: DiagnosisDate             Arthritis                         Diabetes mellitus without complication (CMS/HHS-HCC)                  Hypercholesterolemia              Hypertension                Sleep apnea            Problem List Patient Active Problem List Diagnosis Prostate cancer (CMS/HHS-HCC)       Past Surgical History Past Surgical History: ProcedureLateralityDate            INGUINAL HERNIA REPAIR              07/12/1971            CATARACT EXTRACTION                             KNEE ARTHROSCOPY                                  REPAIR EXTENSOR TENDON FINGER                          Allergies Allergies AllergenReactions AspirinAnaphylaxis  Medications Ordered Prior to Encounter Current Outpatient Medications on File Prior to Visit MedicationSigDispenseRefill            lisinopriL  (ZESTRIL ) 10 MG tablet     Take 10 mg by mouth once daily                                rosuvastatin  (CRESTOR ) 10 MG tablet         Take 10 mg by mouth once daily                                sildenafiL (VIAGRA) 50 MG tablet      Take 50 mg by mouth once daily as needed for Erectile Dysfunction                        tamsulosin (FLOMAX) 0.4 mg capsuleTake 1 capsule (0.4 mg total) by mouth 2 (two) times daily Take 30 minutes after same meal each day.60 capsule11     No current facility-administered medications on file prior to visit.       Family History Family History ProblemRelationAge of Onset            COPD  Mother              Hyperlipidemia (Elevated cholesterol)            Father               Deep vein thrombosis (DVT or abnormal blood clot formation)        Sister                Stomach cancer          Neg Hx                         Colon cancer   Neg Hx                    Tobacco Use History Social History     Tobacco Use Smoking StatusFormer Types:Cigars Quit date:2020 Years since quitting:5.6 Smokeless TobaccoNever       Social History Social History     Socioeconomic  History Marital status:Married Tobacco Use Smoking status:Former                         Types: Cigars                         Quit date:        2020                         Years since quitting:   5.6 Smokeless tobacco:Never Vaping Use Vaping status:Never Used Substance and Sexual Activity Alcohol use:Yes                         Alcohol/week:  0.0 - 1.0 standard drinks of alcohol Drug ldz:Wzczm Sexual activity:Yes                         Partners:         Female     Social Drivers of Health     Housing  Stability: Unknown (10/08/2023)             Housing Stability Vital Sign                        Homeless in the Last Year: No       Objective:     BP 129/76   Pulse (!) 58   Temp 98.7 F (37.1 C) (Oral)   Resp 18   Ht 5' 11 (1.803 m)   Wt 85.7 kg   SpO2 99%   BMI 26.36 kg/m     Body mass index is 26.95 kg/m. Physical Exam Constitutional:      Appearance: Normal appearance.  HENT:     Head: Normocephalic and atraumatic.     Nose: Nose normal. No congestion.     Mouth/Throat:     Mouth: Mucous membranes are moist.     Pharynx: Oropharynx is clear.  Eyes:     Pupils: Pupils are equal, round, and reactive to light.  Cardiovascular:     Rate and Rhythm: Normal rate and regular rhythm.     Pulses: Normal pulses.     Heart sounds: Normal heart sounds. No murmur heard.    No friction rub. No gallop.  Pulmonary:     Effort: Pulmonary effort is normal. No respiratory distress.     Breath sounds: Normal breath sounds. No stridor. No wheezing, rhonchi or rales.  Abdominal:     General: Abdomen is flat.     Hernia: A hernia is present. Hernia is present in the left inguinal area and right inguinal area.  Musculoskeletal:        General: Normal range of motion.     Cervical back: Normal range of motion.  Skin:    General: Skin is warm and dry.  Neurological:     General: No focal deficit present.     Mental Status: He is alert and oriented to person, place,  and time.  Psychiatric:        Mood and Affect: Mood normal.        Thought Content: Thought content normal.          Assessment and Plan: Diagnoses and all orders for this visit:   Unilateral inguinal hernia without obstruction or gangrene, recurrence not specified     Paul Chang is a 73 y.o. male    1.          We will proceed to the OR for a LAP RIGHT inguinal hernia repair with mesh. 2.         All risks and benefits were discussed with the patient, to generally include infection, bleeding, damage to surrounding structures, acute and chronic nerve pain, and recurrence. Alternatives were offered and described.  All questions were answered and the patient voiced understanding of the procedure and wishes to proceed at this point.             No follow-ups on file.   Lynda Leos, MD, [facility], [place] [facility] Surgery

## 2024-04-25 NOTE — Transfer of Care (Signed)
 Immediate Anesthesia Transfer of Care Note  Patient: Paul Chang  Procedure(s) Performed: REPAIR, HERNIA, INGUINAL, LAPAROSCOPIC (Right: Pelvis)  Patient Location: PACU  Anesthesia Type:General  Level of Consciousness: awake, alert , and oriented  Airway & Oxygen Therapy: Patient Spontanous Breathing  Post-op Assessment: Report given to RN and Post -op Vital signs reviewed and stable  Post vital signs: Reviewed and stable  Last Vitals:  Vitals Value Taken Time  BP 153/82 04/25/24 14:40  Temp 36.6 C 04/25/24 14:40  Pulse 67 04/25/24 14:43  Resp 21 04/25/24 14:43  SpO2 97 % 04/25/24 14:43  Vitals shown include unfiled device data.  Last Pain:  Vitals:   04/25/24 1155  TempSrc:   PainSc: 0-No pain         Complications: No notable events documented.

## 2024-04-25 NOTE — Discharge Instructions (Signed)

## 2024-04-25 NOTE — Anesthesia Preprocedure Evaluation (Signed)
 Anesthesia Evaluation  Patient identified by MRN, date of birth, ID band Patient awake    Reviewed: Allergy & Precautions, NPO status , Patient's Chart, lab work & pertinent test results  Airway Mallampati: II  TM Distance: >3 FB Neck ROM: Full    Dental   Pulmonary sleep apnea , former smoker   breath sounds clear to auscultation       Cardiovascular hypertension, Pt. on medications  Rhythm:Regular Rate:Normal     Neuro/Psych negative neurological ROS     GI/Hepatic negative GI ROS, Neg liver ROS,,,  Endo/Other  negative endocrine ROS    Renal/GU negative Renal ROS     Musculoskeletal   Abdominal   Peds  Hematology negative hematology ROS (+)   Anesthesia Other Findings   Reproductive/Obstetrics                              Anesthesia Physical Anesthesia Plan  ASA: 2  Anesthesia Plan: General   Post-op Pain Management: Tylenol PO (pre-op)* and Toradol IV (intra-op)*   Induction: Intravenous  PONV Risk Score and Plan: 2 and Dexamethasone, Ondansetron and Treatment may vary due to age or medical condition  Airway Management Planned: Oral ETT  Additional Equipment:   Intra-op Plan:   Post-operative Plan: Extubation in OR  Informed Consent: I have reviewed the patients History and Physical, chart, labs and discussed the procedure including the risks, benefits and alternatives for the proposed anesthesia with the patient or authorized representative who has indicated his/her understanding and acceptance.     Dental advisory given  Plan Discussed with: CRNA  Anesthesia Plan Comments:         Anesthesia Quick Evaluation

## 2024-04-25 NOTE — Op Note (Addendum)
 04/25/2024  2:28 PM  PATIENT:  Paul Chang  73 y.o. male  PRE-OPERATIVE DIAGNOSIS:  RIGHT INGUINAL HERNIA  POST-OPERATIVE DIAGNOSIS:  RIGHT  indirect INGUINAL HERNIA  PROCEDURE:  Procedure(s) with comments: REPAIR, HERNIA, INGUINAL, LAPAROSCOPIC (Right) - WITH MESH LAP POSS OPEN RIH  SURGEON:  Surgeons and Role:    * Rubin Calamity, MD - Primary  ASSISTANTS: Fairy Costain , PGY-6    ANESTHESIA:   local and general  EBL:  minimal   BLOOD ADMINISTERED:none  DRAINS: none   LOCAL MEDICATIONS USED:  BUPIVICAINE   SPECIMEN:  No Specimen  DISPOSITION OF SPECIMEN:  N/A  COUNTS:  YES  TOURNIQUET:  * No tourniquets in log *  DICTATION: .Dragon Dictation  Counts: reported as correct x 2  Findings:  The patient had a large right indirect hernia   Indications for procedure:  The patient is a 73 year old male with a right inguinal hernia for several months. Patient complained of symptomatology to his right inguinal area. The patient was taken back for elective inguinal hernia repair.  Details of the procedure: The patient was taken back to the operating room. The patient was placed in supine position with bilateral SCDs in place.  The patient was prepped and draped in the usual sterile fashion.  After appropriate anitbiotics were confirmed, a time-out was confirmed and all facts were verified.  0.25% Marcaine was used to infiltrate the umbilical area. A 11-blade was used to cut down the skin and blunt dissection was used to get the anterior fashion.  The anterior fascia was incised approximately 1 cm and the muscles were retracted laterally. Blunt dissection was then used to create a space in the preperitoneal area. At this time a 10 mm camera was then introduced into the space and advanced the pubic tubercle and a 12 mm trocar was placed over this and insufflation was started.  At this time and space was created from medial to laterally the preperitoneal space.  Cooper's  ligament was initially cleaned off.  The hernia sac was identified in the indirect space. Dissection of the hernia sac and cord structures was undertaken the vas deferens was identified and protected in all parts of the case.    Once the hernia sac was taken down to approximately the umbilicus a Bard 3D Max mesh, size: Nickola, was  introduced into the preperitoneal space.  The mesh was brought over to cover the direct and indirect hernia spaces.  This was anchored into place and secured to Cooper's ligament with 4.89mm staples from a Coviden hernia stapler. It was anchored to the anterior abdominal wall with 4.8 mm staples. The hernia sac was seen lying posterior to the mesh. There was no staples placed laterally. The insufflation was evacuated and the peritoneum was seen posterior to the mesh. The trochars were removed. The anterior fascia was reapproximated using #1 Vicryl on a UR- 6.  Intra-abdominal air was evacuated and the Veress needle removed. The skin was reapproximated using 4-0 Monocryl subcuticular fashion and Dermabond. The patient was awakened from general anesthesia and taken to recovery in stable condition.  I was personally present during the key and critical portions of this procedure and immediately available throughout the entire procedure, as documented in my operative note.    PLAN OF CARE: Discharge to home after PACU  PATIENT DISPOSITION:  PACU - hemodynamically stable.   Delay start of Pharmacological VTE agent (>24hrs) due to surgical blood loss or risk of bleeding: not applicable

## 2024-04-26 ENCOUNTER — Encounter (HOSPITAL_COMMUNITY): Payer: Self-pay | Admitting: General Surgery

## 2024-04-26 NOTE — Anesthesia Postprocedure Evaluation (Signed)
 Anesthesia Post Note  Patient: LABIB CWYNAR  Procedure(s) Performed: REPAIR, HERNIA, INGUINAL, LAPAROSCOPIC (Right: Pelvis)     Patient location during evaluation: PACU Anesthesia Type: General Level of consciousness: awake and alert Pain management: pain level controlled Vital Signs Assessment: post-procedure vital signs reviewed and stable Respiratory status: spontaneous breathing, nonlabored ventilation, respiratory function stable and patient connected to nasal cannula oxygen Cardiovascular status: blood pressure returned to baseline and stable Postop Assessment: no apparent nausea or vomiting Anesthetic complications: no   No notable events documented.  Last Vitals:  Vitals:   04/25/24 1500 04/25/24 1510  BP: 134/76 135/77  Pulse: (!) 58 (!) 57  Resp: 15 17  Temp:  36.6 C  SpO2: 95% 96%    Last Pain:  Vitals:   04/25/24 1500  TempSrc:   PainSc: 0-No pain                 Epifanio Lamar BRAVO

## 2024-06-26 DIAGNOSIS — K080 Exfoliation of teeth due to systemic causes: Secondary | ICD-10-CM | POA: Diagnosis not present
